# Patient Record
Sex: Male | Born: 1975 | Race: White | Hispanic: No | Marital: Married | Smoking: Never smoker
Health system: Southern US, Community
[De-identification: ages and names within clinical notes are randomized; demographics above are authoritative.]

## PROBLEM LIST (undated history)

## (undated) DIAGNOSIS — N200 Calculus of kidney: Secondary | ICD-10-CM

## (undated) DIAGNOSIS — S42402A Unspecified fracture of lower end of left humerus, initial encounter for closed fracture: Secondary | ICD-10-CM

## (undated) DIAGNOSIS — K469 Unspecified abdominal hernia without obstruction or gangrene: Secondary | ICD-10-CM

## (undated) DIAGNOSIS — B029 Zoster without complications: Secondary | ICD-10-CM

## (undated) HISTORY — PX: APPENDECTOMY: SHX54

---

## 2003-05-04 ENCOUNTER — Emergency Department (HOSPITAL_COMMUNITY): Admission: EM | Admit: 2003-05-04 | Discharge: 2003-05-05 | Payer: Self-pay | Admitting: Emergency Medicine

## 2012-04-11 ENCOUNTER — Emergency Department (INDEPENDENT_AMBULATORY_CARE_PROVIDER_SITE_OTHER): Payer: Managed Care, Other (non HMO)

## 2012-04-11 ENCOUNTER — Encounter (HOSPITAL_BASED_OUTPATIENT_CLINIC_OR_DEPARTMENT_OTHER): Payer: Self-pay | Admitting: *Deleted

## 2012-04-11 ENCOUNTER — Emergency Department (HOSPITAL_BASED_OUTPATIENT_CLINIC_OR_DEPARTMENT_OTHER)
Admission: EM | Admit: 2012-04-11 | Discharge: 2012-04-11 | Disposition: A | Discharge status: Home / Self Care | Payer: Managed Care, Other (non HMO) | Attending: Emergency Medicine | Admitting: Emergency Medicine

## 2012-04-11 DIAGNOSIS — N1330 Unspecified hydronephrosis: Secondary | ICD-10-CM | POA: Insufficient documentation

## 2012-04-11 DIAGNOSIS — N200 Calculus of kidney: Secondary | ICD-10-CM

## 2012-04-11 DIAGNOSIS — R109 Unspecified abdominal pain: Secondary | ICD-10-CM | POA: Insufficient documentation

## 2012-04-11 DIAGNOSIS — N201 Calculus of ureter: Secondary | ICD-10-CM | POA: Insufficient documentation

## 2012-04-11 DIAGNOSIS — N289 Disorder of kidney and ureter, unspecified: Secondary | ICD-10-CM

## 2012-04-11 HISTORY — DX: Calculus of kidney: N20.0

## 2012-04-11 LAB — URINALYSIS, ROUTINE W REFLEX MICROSCOPIC
Glucose, UA: NEGATIVE mg/dL
Protein, ur: NEGATIVE mg/dL
Specific Gravity, Urine: 1.015 (ref 1.005–1.030)
Urobilinogen, UA: 0.2 mg/dL (ref 0.0–1.0)

## 2012-04-11 LAB — URINE MICROSCOPIC-ADD ON

## 2012-04-11 MED ORDER — TAMSULOSIN HCL 0.4 MG PO CAPS: 0.4000 mg | ORAL_CAPSULE | Freq: Every day | ORAL | Status: DC

## 2012-04-11 MED ORDER — OXYCODONE-ACETAMINOPHEN 5-325 MG PO TABS: 2.0000 | ORAL_TABLET | ORAL | Status: AC | PRN

## 2012-04-11 NOTE — ED Notes (Signed)
Patient transported to CT 

## 2012-04-11 NOTE — ED Provider Notes (Signed)
History     CSN: [id number]  Arrival date & time 04/11/12  1429   First MD Initiated Contact with Patient 04/11/12 1445      Chief Complaint  Patient presents with  . Flank Pain    (Consider location/radiation/quality/duration/timing/severity/associated sxs/prior treatment) Patient is a 36 y.o. male presenting with flank pain. The history is provided by the patient. No language interpreter was used.  Flank Pain This is a new problem. The current episode started today. The problem occurs constantly. The problem has been gradually worsening. Associated symptoms include abdominal pain. The symptoms are aggravated by nothing. He has tried nothing for the symptoms. The treatment provided moderate relief.  Pt complains of left flank pain.   Pt reports he has had kidney stones in the past and this pain is similar.  Pt reports he took 2 ibuprofen before leaving home and feels some better.  Past Medical History  Diagnosis Date  . Kidney calculi     Past Surgical History  Procedure Date  . Appendectomy     History reviewed. No pertinent family history.  History  Substance Use Topics  . Smoking status: Never Smoker   . Smokeless tobacco: Not on file  . Alcohol Use: No      Review of Systems  Gastrointestinal: Positive for abdominal pain.  Genitourinary: Positive for flank pain.  All other systems reviewed and are negative.    Allergies  Review of patient's allergies indicates no known allergies.  Home Medications   Current Outpatient Rx  Name Route Sig Dispense Refill  . AMOXICILLIN 500 MG PO TABS Oral Take 500 mg by mouth 2 (two) times daily.    . IBUPROFEN 200 MG PO TABS Oral Take 400 mg by mouth every 6 (six) hours as needed. Patient used this medication for his pain.      BP 140/90  Pulse 87  Temp(Src) 98.5 F (36.9 C) (Oral)  Resp 16  Ht 6' (1.829 m)  Wt 202 lb (91.627 kg)  BMI 27.40 kg/m2  SpO2 100%  Physical Exam  Nursing note and vitals  reviewed. Constitutional: He appears well-developed and well-nourished.  HENT:  Head: Normocephalic and atraumatic.  Right Ear: External ear normal.  Left Ear: External ear normal.  Eyes: Pupils are equal, round, and reactive to light.  Neck: Normal range of motion.  Cardiovascular: Normal rate.   Pulmonary/Chest: Effort normal.  Abdominal: Soft. There is no tenderness.  Musculoskeletal: Normal range of motion.  Neurological: He is alert.  Skin: Skin is warm.    ED Course  Procedures (including critical care time)  Labs Reviewed  URINALYSIS, ROUTINE W REFLEX MICROSCOPIC - Abnormal; Notable for the following:    Leukocytes, UA SMALL (*)    All other components within normal limits  URINE MICROSCOPIC-ADD ON   No results found.   No diagnosis found.    MDM   Results for orders placed during the hospital encounter of 04/11/12  URINALYSIS, ROUTINE W REFLEX MICROSCOPIC      Component Value Range   Color, Urine YELLOW  YELLOW    APPearance CLEAR  CLEAR    Specific Gravity, Urine 1.015  1.005 - 1.030    pH 7.0  5.0 - 8.0    Glucose, UA NEGATIVE  NEGATIVE (mg/dL)   Hgb urine dipstick NEGATIVE  NEGATIVE    Bilirubin Urine NEGATIVE  NEGATIVE    Ketones, ur NEGATIVE  NEGATIVE (mg/dL)   Protein, ur NEGATIVE  NEGATIVE (mg/dL)   Urobilinogen, UA 0.2  0.0 - 1.0 (mg/dL)   Nitrite NEGATIVE  NEGATIVE    Leukocytes, UA SMALL (*) NEGATIVE   URINE MICROSCOPIC-ADD ON      Component Value Range   Squamous Epithelial / LPF RARE  RARE    WBC, UA 3-6  <3 (WBC/hpf)   RBC / HPF 0-2  <3 (RBC/hpf)   Bacteria, UA RARE  RARE    Ct Abdomen Pelvis Wo Contrast  04/11/2012  *RADIOLOGY REPORT*  Clinical Data:  right flank pain, history of nephrolithiasis, prior appendectomy  CT ABDOMEN AND PELVIS WITHOUT CONTRAST  Technique:  Multidetector CT imaging of the abdomen and pelvis was performed following the standard protocol without intravenous contrast.  Comparison: None.  Findings: Right kidney  demonstrates mild hydronephrosis and proximal hydroureter.  Trace amount of perinephric and peri ureteral edema / stranding proximally.  This is secondary to a proximal right ureteral mildly obstructing calculus, maximal diameter 4 mm, image 46.  Low density cortical area in the right kidney lower pole roughly measures 1.5 cm, suspicious for a small right renal cyst but indeterminate by noncontrast CT.  Consider follow-up ultrasound for correlation.  Left kidney demonstrates a branching staghorn calculus in the mid to lower pole roughly measuring 3 x 2 cm on coronal image 51.  No acute obstruction on the left side.  Lung bases clear.  Normal heart size.  No pericardial or pleural effusion.  Liver, collapsed gallbladder, biliary system, pancreas, spleen, and adrenal glands are within normal limits for noncontrast study.  Negative for bowel obstruction, dilatation, ileus, or free air.  No abdominal free fluid, fluid collection, hemorrhage, adenopathy, or abscess  No pelvic free fluid, fluid collection, hemorrhage, hematoma, adenopathy, inguinal abnormality or hernia.  Pelvic calcifications likely venous phleboliths.  No acute osseous finding.  IMPRESSION: 4 mm mildly obstructing proximal right ureteral calculus with mild right hydronephrosis and hydroureter.  Mid to lower pole left staghorn calculus roughly measuring 3 x 2 cm.  Indeterminate low density lesion right kidney lower pole, could represent a renal cyst.  Consider follow-up ultrasound.  Original Report Authenticated By: Judie Petit. Ruel Favors, M.D.      Pt is still pain free.  I counseled pt I advised follow up with Urology.  Pt counseled on results.     Lonia Skinner Thompson, [place] 04/12/12 2203

## 2012-04-11 NOTE — Discharge Instructions (Signed)
Kidney Stones Kidney stones (ureteral lithiasis) are solid masses that form inside your kidneys. The intense pain is caused by the stone moving through the kidney, ureter, bladder, and urethra (urinary tract). When the stone moves, the ureter starts to spasm around the stone. The stone is usually passed in the urine.  HOME CARE  Drink enough fluids to keep your pee (urine) clear or pale yellow. This helps to get the stone out.   Strain all pee through the provided strainer. Do not pee without peeing through the strainer, not even once. If you pee the stone out, catch it. The stone may be as small as a grain of salt. Take this to your doctor.   Only take medicine as told by your doctor.   Follow up with your doctor as told.   Get follow-up X-rays as told by your doctor.  GET HELP RIGHT AWAY IF:   Your pain does not get better with medicine.   You have a fever.   Your pain increases and gets worse over 18 hours.   You have new belly (abdominal) pain.   You feel faint or pass out.  MAKE SURE YOU:   Understand these instructions.   Will watch your condition.   Will get help right away if you are not doing well or get worse.  Document Released: 05/11/2008 Document Revised: 11/12/2011 Document Reviewed: 09/20/2009 ExitCare Patient Information 2012 [employer].Kidney Stones Kidney stones (ureteral lithiasis) are solid masses that form inside your kidneys. The intense pain is caused by the stone moving through the kidney, ureter, bladder, and urethra (urinary tract). When the stone moves, the ureter starts to spasm around the stone. The stone is usually passed in the urine.  HOME CARE  Drink enough fluids to keep your pee (urine) clear or pale yellow. This helps to get the stone out.   Strain all pee through the provided strainer. Do not pee without peeing through the strainer, not even once. If you pee the stone out, catch it. The stone may be as small as a grain of salt. Take this  to your doctor.   Only take medicine as told by your doctor.   Follow up with your doctor as told.   Get follow-up X-rays as told by your doctor.  GET HELP RIGHT AWAY IF:   Your pain does not get better with medicine.   You have a fever.   Your pain increases and gets worse over 18 hours.   You have new belly (abdominal) pain.   You feel faint or pass out.  MAKE SURE YOU:   Understand these instructions.   Will watch your condition.   Will get help right away if you are not doing well or get worse.  Document Released: 05/11/2008 Document Revised: 11/12/2011 Document Reviewed: 09/20/2009 Exit[employer] Patient Information 2012 [employer]. 

## 2012-04-11 NOTE — ED Notes (Signed)
Pt c/o left flank pain x 2 hrs ago, Hx kidney stones

## 2012-04-14 NOTE — ED Provider Notes (Signed)
Medical screening examination/treatment/procedure(s) were performed by non-physician practitioner and as supervising physician I was immediately available for consultation/collaboration.  Izora Benn, MD 04/14/12 0659 

## 2012-05-20 ENCOUNTER — Other Ambulatory Visit: Payer: Self-pay | Admitting: Urology

## 2012-05-20 DIAGNOSIS — N200 Calculus of kidney: Secondary | ICD-10-CM

## 2012-05-31 ENCOUNTER — Encounter (HOSPITAL_COMMUNITY): Payer: Self-pay | Admitting: Pharmacy Technician

## 2012-05-31 ENCOUNTER — Other Ambulatory Visit: Payer: Self-pay | Admitting: Radiology

## 2012-06-01 ENCOUNTER — Encounter (HOSPITAL_COMMUNITY)
Admission: RE | Admit: 2012-06-01 | Discharge: 2012-06-01 | Disposition: A | Discharge status: Home / Self Care | Payer: Managed Care, Other (non HMO) | Source: Ambulatory Visit | Attending: Urology | Admitting: Urology

## 2012-06-01 ENCOUNTER — Encounter (HOSPITAL_COMMUNITY): Payer: Self-pay

## 2012-06-01 DIAGNOSIS — B029 Zoster without complications: Secondary | ICD-10-CM

## 2012-06-01 DIAGNOSIS — K469 Unspecified abdominal hernia without obstruction or gangrene: Secondary | ICD-10-CM

## 2012-06-01 DIAGNOSIS — S42402A Unspecified fracture of lower end of left humerus, initial encounter for closed fracture: Secondary | ICD-10-CM

## 2012-06-01 HISTORY — DX: Unspecified abdominal hernia without obstruction or gangrene: K46.9

## 2012-06-01 HISTORY — DX: Unspecified fracture of lower end of left humerus, initial encounter for closed fracture: S42.402A

## 2012-06-01 HISTORY — PX: TESTICLE SURGERY: SHX794

## 2012-06-01 HISTORY — DX: Zoster without complications: B02.9

## 2012-06-01 LAB — BASIC METABOLIC PANEL
BUN: 18 mg/dL (ref 6–23)
CO2: 31 mEq/L (ref 19–32)
Glucose, Bld: 104 mg/dL — ABNORMAL HIGH (ref 70–99)
Potassium: 4.7 mEq/L (ref 3.5–5.1)
Sodium: 139 mEq/L (ref 135–145)

## 2012-06-01 LAB — CBC
Hemoglobin: 14 g/dL (ref 13.0–17.0)
MCH: 27.9 pg (ref 26.0–34.0)
MCV: 84.1 fL (ref 78.0–100.0)
RBC: 5.02 MIL/uL (ref 4.22–5.81)

## 2012-06-01 NOTE — Pre-Procedure Instructions (Signed)
06-01-12 1545 pt. Notified to fill RX for Mupirocin and use as directed. Positive for Staph aureus with PCR screen today.

## 2012-06-01 NOTE — Patient Instructions (Addendum)
20 George Barton  06/01/2012   Your procedure is scheduled on:  7-9 -2013  Report to [facility] Interventional Radiology at   [facility]     AM .  Call this number if you have problems the morning of surgery: [phone]   Remember:   Do not eat food:After Midnight.    Take these medicines the morning of surgery with A SIP OF WATER: none   Do not wear jewelry, make-up or nail polish.  Do not wear lotions, powders, or perfumes. You may wear deodorant.  Do not shave 48 hours prior to surgery.(face and neck okay, no shaving of legs)  Do not bring valuables to the hospital.  Contacts, dentures or bridgework may not be worn into surgery.  Leave suitcase in the car. After surgery it may be brought to your room.  For patients admitted to the hospital, checkout time is 11:00 AM the day of discharge.   Patients discharged the day of surgery will not be allowed to drive home.  Name and phone number of your driver: spouse  Special Instructions: CHG Shower Use Special Wash: 1/2 bottle night before surgery and 1/2 bottle morning of surgery.(avoid face and genitals)   Please read over the following fact sheets that you were given: MRSA Information, Blood Transfusion fact sheet, Incentive Spirometry Instruction.

## 2012-06-07 ENCOUNTER — Encounter (HOSPITAL_COMMUNITY): Payer: Self-pay | Admitting: Pharmacy Technician

## 2012-06-10 ENCOUNTER — Other Ambulatory Visit: Payer: Self-pay | Admitting: Physician Assistant

## 2012-06-13 ENCOUNTER — Telehealth (HOSPITAL_COMMUNITY): Payer: Self-pay | Admitting: Urology

## 2012-06-13 NOTE — H&P (Signed)
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 Pt underwent CTU May 2013 for right flank pain which revealed a 4 mm proximal right ureteral calculus with mild hydro and a ~3 x 2 cm left staghorn calculus. KUB revealed the left staghorn but no other stones. I revealed the KUB and CT images. Pt UA cleared and he is having no further flank pain. His urine is clear.   Has passed several stones (a group of tiny stones) 4-5 yrs ago. He has never needed stone surgery.   Interval history   He returns and passed the right ureteral stone. He has no residual flank pain. He has no dysuria or hematuria.   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 He was started on 10 mEq K Citrate TID.    Past Medical History Problems  1. History of  Nephrolithiasis V13.01  Surgical History Problems  1. History of  Appendectomy  Current Meds 1. Ibuprofen 800 MG Oral Tablet; Therapy: 26Mar2013 to 2. OxyCODONE HCl CAPS; Therapy: (Recorded:28May2013) to 3. Potassium Citrate ER 10 MEQ (1080 MG) Oral Tablet Extended Release; TAKE 1 TABLET TID  with meals; Therapy: 10Jun2013 to (Evaluate:05Jun2014)  Requested for: 10Jun2013; Last  Rx:10Jun2013  Allergies Medication  1. No Known Drug Allergies  Family History Problems  1. Family history of  Family Health Status Number Of Children 1 daughter  Social History Problems  1. Caffeine Use 2. Never A Smoker 3. 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 History of  Alcohol Use 3-4 cups daily 5. History of  Tobacco Use 305.1  Review of Systems Genitourinary, constitutional, skin, eye, otolaryngeal, hematologic/lymphatic, cardiovascular, pulmonary, endocrine, musculoskeletal, gastrointestinal, neurological and psychiatric system(s) were reviewed and pertinent findings if present are noted.    Vitals Vital Signs [Data Includes:  Last 1 Day]  13Jun2013 01:25PM  BMI Calculated: 25.01 BSA Calculated: 2.19 Height: 6 ft 3 in Weight: 199 lb  Blood Pressure: 116 / 78 Heart Rate: 73  Physical Exam Constitutional: Well nourished and well developed . No acute distress.  ENT:. The ears and nose are normal in appearance.  Neck: The appearance of the neck is normal and no neck mass is present.  Pulmonary: No respiratory distress and normal respiratory rhythm and effort.  Cardiovascular: Heart rate and rhythm are normal . No peripheral edema.  Abdomen: The abdomen is soft and nontender. No masses are palpated. No CVA tenderness. No hernias are palpable. No hepatosplenomegaly noted.  Lymphatics: The femoral and inguinal nodes are not enlarged or tender.  Skin: Normal skin turgor, no visible rash and no visible skin lesions.  Neuro/Psych:. Mood and affect are appropriate.    Results/Data Urine [Data Includes: Last 1 Day]   13Jun2013  COLOR YELLOW   APPEARANCE CLEAR   SPECIFIC GRAVITY 1.020   pH 6.5   GLUCOSE NEG mg/dL  BILIRUBIN NEG   KETONE NEG mg/dL  BLOOD TRACE   PROTEIN NEG mg/dL  UROBILINOGEN 0.2 mg/dL  NITRITE NEG   LEUKOCYTE ESTERASE TRACE   SQUAMOUS EPITHELIAL/HPF RARE   WBC 4-6 WBC/hpf  RBC 0-3 RBC/hpf  BACTERIA RARE   CRYSTALS NONE SEEN   CASTS NONE SEEN    Assessment Assessed  1. 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 Follow-up Schedule Surgery Office  Follow-up  Done: 13Jun2013  Discussion/Summary  Based on his 24 hour stone profile we discussed increasing his  fluids and he was tarted on 10 mEq of potassium citrate t.i.d. He was also instructed on a normal calcium intake.  We also went back over the nature, risks and benefits of PCNL. We discussed risks of bleeding, infection and injury to the kidney. We discussed injury to adjacent structures such as the colon. Discuss rare bleeding requiring transfusion or embolization. We discussed the need for staged or multiple  procedures and ureteral stents or nephrostomy tubes. All questions answered and he elects to proceed.  Urine sent for Cx as a precaution.       Signatures Electronically signed by : Jerilee Field, M.D.; May 20 2012 11:58AM

## 2012-06-14 ENCOUNTER — Ambulatory Visit (HOSPITAL_COMMUNITY): Payer: Managed Care, Other (non HMO)

## 2012-06-14 ENCOUNTER — Encounter (HOSPITAL_COMMUNITY)
Admission: RE | Disposition: A | Discharge status: Home / Self Care | Payer: Self-pay | Source: Ambulatory Visit | Attending: Urology

## 2012-06-14 ENCOUNTER — Encounter (HOSPITAL_COMMUNITY): Payer: Self-pay | Admitting: Anesthesiology

## 2012-06-14 ENCOUNTER — Ambulatory Visit (HOSPITAL_COMMUNITY): Payer: Managed Care, Other (non HMO) | Admitting: Anesthesiology

## 2012-06-14 ENCOUNTER — Encounter (HOSPITAL_COMMUNITY): Payer: Self-pay | Admitting: *Deleted

## 2012-06-14 ENCOUNTER — Ambulatory Visit (HOSPITAL_COMMUNITY)
Admission: RE | Admit: 2012-06-14 | Discharge: 2012-06-14 | Disposition: A | Discharge status: Home / Self Care | Payer: Managed Care, Other (non HMO) | Source: Ambulatory Visit | Attending: Urology | Admitting: Urology

## 2012-06-14 ENCOUNTER — Inpatient Hospital Stay (HOSPITAL_COMMUNITY)
Admission: RE | Admit: 2012-06-14 | Discharge: 2012-06-17 | DRG: 660 | Disposition: A | Discharge status: Home / Self Care | Payer: Managed Care, Other (non HMO) | Source: Ambulatory Visit | Attending: Urology | Admitting: Urology

## 2012-06-14 DIAGNOSIS — N201 Calculus of ureter: Secondary | ICD-10-CM | POA: Diagnosis present

## 2012-06-14 DIAGNOSIS — N200 Calculus of kidney: Principal | ICD-10-CM | POA: Diagnosis present

## 2012-06-14 DIAGNOSIS — Z87891 Personal history of nicotine dependence: Secondary | ICD-10-CM

## 2012-06-14 DIAGNOSIS — Z9089 Acquired absence of other organs: Secondary | ICD-10-CM

## 2012-06-14 DIAGNOSIS — N1330 Unspecified hydronephrosis: Secondary | ICD-10-CM | POA: Diagnosis present

## 2012-06-14 HISTORY — PX: NEPHROLITHOTOMY: SHX5134

## 2012-06-14 LAB — GLUCOSE, CAPILLARY: Glucose-Capillary: 145 mg/dL — ABNORMAL HIGH (ref 70–99)

## 2012-06-14 LAB — ABO/RH: ABO/RH(D): A POS

## 2012-06-14 SURGERY — NEPHROLITHOTOMY PERCUTANEOUS
Anesthesia: General | Laterality: Left | Wound class: Clean Contaminated

## 2012-06-14 MED ORDER — DEXAMETHASONE SODIUM PHOSPHATE 10 MG/ML IJ SOLN
INTRAMUSCULAR | Status: DC | PRN
  Administered 2012-06-14: 10 mg via INTRAVENOUS

## 2012-06-14 MED ORDER — FENTANYL CITRATE 0.05 MG/ML IJ SOLN
INTRAMUSCULAR | Status: AC
  Filled 2012-06-14: qty 4

## 2012-06-14 MED ORDER — ONDANSETRON HCL 4 MG/2ML IJ SOLN
INTRAMUSCULAR | Status: DC | PRN
  Administered 2012-06-14: 4 mg via INTRAVENOUS

## 2012-06-14 MED ORDER — ACETAMINOPHEN 10 MG/ML IV SOLN
INTRAVENOUS | Status: AC
  Filled 2012-06-14: qty 100

## 2012-06-14 MED ORDER — MIDAZOLAM HCL 5 MG/5ML IJ SOLN
INTRAMUSCULAR | Status: AC | PRN
  Administered 2012-06-14: 2 mg via INTRAVENOUS
  Administered 2012-06-14: 4 mg via INTRAVENOUS

## 2012-06-14 MED ORDER — PROPOFOL 10 MG/ML IV BOLUS
INTRAVENOUS | Status: DC | PRN
  Administered 2012-06-14: 180 mg via INTRAVENOUS

## 2012-06-14 MED ORDER — CEFAZOLIN SODIUM 1-5 GM-% IV SOLN
1.0000 g | Freq: Three times a day (TID) | INTRAVENOUS | Status: DC
  Administered 2012-06-14 – 2012-06-17 (×9): 1 g via INTRAVENOUS
  Filled 2012-06-14 (×10): qty 50

## 2012-06-14 MED ORDER — DOCUSATE SODIUM 100 MG PO CAPS
100.0000 mg | ORAL_CAPSULE | Freq: Two times a day (BID) | ORAL | Status: DC
  Administered 2012-06-15 – 2012-06-17 (×5): 100 mg via ORAL
  Filled 2012-06-14 (×8): qty 1

## 2012-06-14 MED ORDER — FENTANYL CITRATE 0.05 MG/ML IJ SOLN
25.0000 ug | INTRAMUSCULAR | Status: DC | PRN
  Administered 2012-06-14 (×2): 25 ug via INTRAVENOUS

## 2012-06-14 MED ORDER — FENTANYL CITRATE 0.05 MG/ML IJ SOLN
INTRAMUSCULAR | Status: DC | PRN
  Administered 2012-06-14: 100 ug via INTRAVENOUS

## 2012-06-14 MED ORDER — ACETAMINOPHEN 10 MG/ML IV SOLN
INTRAVENOUS | Status: DC | PRN
  Administered 2012-06-14: 1000 mg via INTRAVENOUS

## 2012-06-14 MED ORDER — MIDAZOLAM HCL 5 MG/5ML IJ SOLN
INTRAMUSCULAR | Status: DC | PRN
  Administered 2012-06-14: 2 mg via INTRAVENOUS

## 2012-06-14 MED ORDER — FENTANYL CITRATE 0.05 MG/ML IJ SOLN
INTRAMUSCULAR | Status: AC | PRN
  Administered 2012-06-14 (×3): 100 ug via INTRAVENOUS

## 2012-06-14 MED ORDER — CEFAZOLIN SODIUM-DEXTROSE 2-3 GM-% IV SOLR
2.0000 g | Freq: Once | INTRAVENOUS | Status: AC
  Administered 2012-06-14: 2 g via INTRAVENOUS

## 2012-06-14 MED ORDER — CIPROFLOXACIN IN D5W 400 MG/200ML IV SOLN
INTRAVENOUS | Status: AC
  Filled 2012-06-14: qty 200

## 2012-06-14 MED ORDER — EPHEDRINE SULFATE 50 MG/ML IJ SOLN
INTRAMUSCULAR | Status: DC | PRN
  Administered 2012-06-14 (×2): 10 mg via INTRAVENOUS

## 2012-06-14 MED ORDER — FENTANYL CITRATE 0.05 MG/ML IJ SOLN
INTRAMUSCULAR | Status: AC
  Filled 2012-06-14: qty 2

## 2012-06-14 MED ORDER — CIPROFLOXACIN IN D5W 400 MG/200ML IV SOLN
400.0000 mg | INTRAVENOUS | Status: AC
  Administered 2012-06-14: 400 mg via INTRAVENOUS

## 2012-06-14 MED ORDER — HYDROMORPHONE HCL PF 1 MG/ML IJ SOLN
INTRAMUSCULAR | Status: AC
  Filled 2012-06-14: qty 1

## 2012-06-14 MED ORDER — OXYCODONE-ACETAMINOPHEN 5-325 MG PO TABS
1.0000 | ORAL_TABLET | ORAL | Status: DC | PRN
  Administered 2012-06-14 – 2012-06-17 (×7): 1 via ORAL
  Filled 2012-06-14: qty 1
  Filled 2012-06-14: qty 2
  Filled 2012-06-14 (×6): qty 1

## 2012-06-14 MED ORDER — ROCURONIUM BROMIDE 100 MG/10ML IV SOLN
INTRAVENOUS | Status: DC | PRN
  Administered 2012-06-14: 50 mg via INTRAVENOUS

## 2012-06-14 MED ORDER — HYDROMORPHONE HCL PF 1 MG/ML IJ SOLN
0.5000 mg | INTRAMUSCULAR | Status: DC | PRN
  Administered 2012-06-14 – 2012-06-16 (×3): 1 mg via INTRAVENOUS
  Filled 2012-06-14 (×3): qty 1

## 2012-06-14 MED ORDER — IOHEXOL 300 MG/ML  SOLN
INTRAMUSCULAR | Status: DC | PRN
  Administered 2012-06-14: 50 mL via ORAL

## 2012-06-14 MED ORDER — SODIUM CHLORIDE 0.9 % IV SOLN
INTRAVENOUS | Status: DC
  Administered 2012-06-14: 23:00:00 via INTRAVENOUS

## 2012-06-14 MED ORDER — ACETAMINOPHEN 325 MG PO TABS
650.0000 mg | ORAL_TABLET | ORAL | Status: DC | PRN
  Administered 2012-06-17: 650 mg via ORAL
  Filled 2012-06-14: qty 2

## 2012-06-14 MED ORDER — FLUTICASONE PROPIONATE 50 MCG/ACT NA SUSP
2.0000 | Freq: Every day | NASAL | Status: DC
  Filled 2012-06-14: qty 16

## 2012-06-14 MED ORDER — SODIUM CHLORIDE 0.9 % IJ SOLN: 3.0000 mL | INTRAMUSCULAR | Status: DC | PRN

## 2012-06-14 MED ORDER — IOHEXOL 300 MG/ML  SOLN
INTRAMUSCULAR | Status: AC
  Filled 2012-06-14: qty 2

## 2012-06-14 MED ORDER — HYDROMORPHONE HCL PF 1 MG/ML IJ SOLN
0.2500 mg | INTRAMUSCULAR | Status: DC | PRN
  Administered 2012-06-14 (×2): 0.5 mg via INTRAVENOUS

## 2012-06-14 MED ORDER — SODIUM CHLORIDE 0.9 % IJ SOLN
3.0000 mL | Freq: Two times a day (BID) | INTRAMUSCULAR | Status: DC
  Administered 2012-06-15: 3 mL via INTRAVENOUS

## 2012-06-14 MED ORDER — MIDAZOLAM HCL 2 MG/2ML IJ SOLN
INTRAMUSCULAR | Status: AC
  Filled 2012-06-14: qty 4

## 2012-06-14 MED ORDER — ONDANSETRON HCL 4 MG/2ML IJ SOLN
4.0000 mg | INTRAMUSCULAR | Status: DC | PRN
  Administered 2012-06-14: 4 mg via INTRAVENOUS
  Filled 2012-06-14: qty 2

## 2012-06-14 MED ORDER — BACITRACIN-NEOMYCIN-POLYMYXIN 400-5-5000 EX OINT: 1.0000 "application " | TOPICAL_OINTMENT | Freq: Three times a day (TID) | CUTANEOUS | Status: DC | PRN

## 2012-06-14 MED ORDER — LACTATED RINGERS IV SOLN
INTRAVENOUS | Status: DC
  Administered 2012-06-14: 15:00:00 via INTRAVENOUS

## 2012-06-14 MED ORDER — CEFAZOLIN SODIUM-DEXTROSE 2-3 GM-% IV SOLR
INTRAVENOUS | Status: AC
  Filled 2012-06-14: qty 50

## 2012-06-14 MED ORDER — LACTATED RINGERS IV SOLN
INTRAVENOUS | Status: DC
  Administered 2012-06-14: 13:00:00 via INTRAVENOUS
  Administered 2012-06-14: 1000 mL via INTRAVENOUS

## 2012-06-14 MED ORDER — SODIUM CHLORIDE 0.9 % IR SOLN
Status: DC | PRN
  Administered 2012-06-14: 12000 mL

## 2012-06-14 MED ORDER — LACTATED RINGERS IV SOLN
INTRAVENOUS | Status: DC
  Administered 2012-06-14 – 2012-06-15 (×2): via INTRAVENOUS

## 2012-06-14 MED ORDER — IOHEXOL 300 MG/ML  SOLN: 30.0000 mL | Freq: Once | INTRAMUSCULAR | Status: DC | PRN

## 2012-06-14 MED ORDER — SODIUM CHLORIDE 0.9 % IV SOLN: 250.0000 mL | INTRAVENOUS | Status: DC

## 2012-06-14 SURGICAL SUPPLY — 46 items
BAG URINE DRAINAGE (UROLOGICAL SUPPLIES) ×2 IMPLANT
BASKET STONE NITINOL 3FRX115MB (UROLOGICAL SUPPLIES) IMPLANT
BASKET ZERO TIP NITINOL 2.4FR (BASKET) ×2 IMPLANT
BENZOIN TINCTURE PRP APPL 2/3 (GAUZE/BANDAGES/DRESSINGS) ×4 IMPLANT
CATH FOLEY 2W COUNCIL 20FR 5CC (CATHETERS) ×4 IMPLANT
CATH INTERMIT  6FR 70CM (CATHETERS) ×2 IMPLANT
CATH ROBINSON RED A/P 20FR (CATHETERS) IMPLANT
CATH URET DUAL LUMEN 6-10FR 50 (CATHETERS) ×2 IMPLANT
CATH X-FORCE N30 NEPHROSTOMY (TUBING) ×2 IMPLANT
CLOTH BEACON ORANGE TIMEOUT ST (SAFETY) ×2 IMPLANT
COVER SURGICAL LIGHT HANDLE (MISCELLANEOUS) ×2 IMPLANT
DRAPE C-ARM 42X72 X-RAY (DRAPES) ×2 IMPLANT
DRAPE CAMERA CLOSED 9X96 (DRAPES) ×2 IMPLANT
DRAPE LINGEMAN PERC (DRAPES) ×2 IMPLANT
DRAPE SURG IRRIG POUCH 19X23 (DRAPES) ×2 IMPLANT
DRAPE UTILITY XL STRL (DRAPES) ×2 IMPLANT
DRSG TEGADERM 8X12 (GAUZE/BANDAGES/DRESSINGS) ×4 IMPLANT
GLOVE BIOGEL M 8.0 STRL (GLOVE) ×2 IMPLANT
GLOVE BIOGEL M STRL SZ7.5 (GLOVE) ×2 IMPLANT
GOWN STRL REIN XL XLG (GOWN DISPOSABLE) ×4 IMPLANT
KIT BASIN OR (CUSTOM PROCEDURE TRAY) ×2 IMPLANT
LASER FIBER DISP (UROLOGICAL SUPPLIES) IMPLANT
LASER FIBER DISP 1000U (UROLOGICAL SUPPLIES) IMPLANT
MANIFOLD NEPTUNE II (INSTRUMENTS) ×2 IMPLANT
NS IRRIG 1000ML POUR BTL (IV SOLUTION) ×2 IMPLANT
PACK BASIC VI WITH GOWN DISP (CUSTOM PROCEDURE TRAY) ×4 IMPLANT
PAD ABD 7.5X8 STRL (GAUZE/BANDAGES/DRESSINGS) ×4 IMPLANT
PROBE EHL 9 FR 470CM (MISCELLANEOUS) IMPLANT
PROBE LITHOCLAST ULTRA 3.8X403 (UROLOGICAL SUPPLIES) ×2 IMPLANT
PROBE PNEUMATIC 1.0MMX570MM (UROLOGICAL SUPPLIES) ×2 IMPLANT
SET IRRIG Y TYPE TUR BLADDER L (SET/KITS/TRAYS/PACK) ×2 IMPLANT
SET WARMING FLUID IRRIGATION (MISCELLANEOUS) ×2 IMPLANT
SPONGE GAUZE 4X4 12PLY (GAUZE/BANDAGES/DRESSINGS) ×2 IMPLANT
SPONGE LAP 4X18 X RAY DECT (DISPOSABLE) ×2 IMPLANT
STENT CONTOUR 6FRX26X.038 (STENTS) IMPLANT
STENT ENDOURETEROTOMY 7-14 26C (STENTS) IMPLANT
STONE CATCHER W/TUBE ADAPTER (UROLOGICAL SUPPLIES) ×2 IMPLANT
SUT SILK 2 0 30  PSL (SUTURE) ×2
SUT SILK 2 0 30 PSL (SUTURE) ×2 IMPLANT
SYR 20CC LL (SYRINGE) ×4 IMPLANT
SYR 30ML LL (SYRINGE) ×2 IMPLANT
SYRINGE 10CC LL (SYRINGE) ×2 IMPLANT
TOWEL OR NON WOVEN STRL DISP B (DISPOSABLE) ×2 IMPLANT
TRAY FOLEY CATH 14FRSI W/METER (CATHETERS) ×2 IMPLANT
TUBING CONNECTING 10 (TUBING) ×6 IMPLANT
WATER STERILE IRR 1500ML POUR (IV SOLUTION) ×2 IMPLANT

## 2012-06-14 NOTE — Brief Op Note (Signed)
06/14/2012  1:43 PM  PATIENT:  George Barton  36 y.o. male  PRE-OPERATIVE DIAGNOSIS:  left nephrolithiasis  POST-OPERATIVE DIAGNOSIS:  left nephrolithiasis  PROCEDURE:  Procedure(s) (LRB): NEPHROLITHOTOMY PERCUTANEOUS (Left)  SURGEON:  Surgeon(s) and Role:    * [name], MD - Primary  ANESTHESIA:   general  EBL:  Total I/O In: 1000 [I.V.:1000] Out: - not recorded...continuous irrigation  BLOOD ADMINISTERED:none  DRAINS: Urinary Catheter (Foley) , Left nephrostomy, Left nephroureteral stent  LOCAL MEDICATIONS USED:  NONE  SPECIMEN:  Source of Specimen:  stones, left kidney  DISPOSITION OF SPECIMEN:  N/A - office lab   COUNTS:  YES  DICTATION: .Other Dictation: Dictation Number dictated  PLAN OF CARE: Admit for overnight observation  PATIENT DISPOSITION:  PACU - hemodynamically stable.   Delay start of Pharmacological VTE agent (>24hrs) due to surgical blood loss or risk of bleeding: yes  [phone]

## 2012-06-14 NOTE — Transfer of Care (Signed)
Immediate Anesthesia Transfer of Care Note  Patient: George Barton  Procedure(s) Performed: Procedure(s) (LRB): NEPHROLITHOTOMY PERCUTANEOUS (Left)  Patient Location: PACU  Anesthesia Type: General  Level of Consciousness: awake and sedated  Airway & Oxygen Therapy: Patient Spontanous Breathing and Patient connected to face mask oxygen  Post-op Assessment: Report given to PACU RN and Post -op Vital signs reviewed and stable  Post vital signs: Reviewed and stable  Complications: No apparent anesthesia complications

## 2012-06-14 NOTE — Anesthesia Postprocedure Evaluation (Signed)
  Anesthesia Post-op Note  Patient: George Barton  Procedure(s) Performed: Procedure(s) (LRB): NEPHROLITHOTOMY PERCUTANEOUS (Left)  Patient Location: PACU  Anesthesia Type: General  Level of Consciousness: awake and alert   Airway and Oxygen Therapy: Patient Spontanous Breathing  Post-op Pain: mild  Post-op Assessment: Post-op Vital signs reviewed, Patient's Cardiovascular Status Stable, Respiratory Function Stable, Patent Airway and No signs of Nausea or vomiting  Post-op Vital Signs: stable  Complications: No apparent anesthesia complications

## 2012-06-14 NOTE — Anesthesia Preprocedure Evaluation (Signed)

## 2012-06-14 NOTE — H&P (Signed)
George Barton is an 36 y.o. male.   Chief Complaint: left kidney stone HPI: Patient with history of left staghorn renal calculus presents today for percutaneous nephrostomy prior to nephrolithotomy.  Past Medical History  Diagnosis Date  . Kidney calculi   . Shingles 06-01-12    2 yrs ago- outbreak around breast/back  . Hernia 06-01-12    in past-corrected self  . Elbow fracture, left 06-01-12    past hx.pinning-removed    Past Surgical History  Procedure Date  . Appendectomy   . 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 Social History:  reports that he has never smoked. He does not have any smokeless tobacco history on file. He reports that he drinks alcohol. He reports that he does not use illicit drugs.  Allergies: No Known Allergies  Current facility-administered medications:ciprofloxacin (CIPRO) 400 MG/200ML IVPB, , , ,  Current outpatient prescriptions:ibuprofen (ADVIL,MOTRIN) 200 MG tablet, Take 400 mg by mouth every 6 (six) hours as needed. Patient used this medication for his pain., Disp: , Rfl: ;  mometasone (NASONEX) 50 MCG/ACT nasal spray, Place 2 sprays into the nose daily., Disp: , Rfl: ;  potassium citrate (UROCIT-K) 10 MEQ (1080 MG) SR tablet, Take 10 mEq by mouth 3 (three) times daily with meals., Disp: , Rfl:  Facility-Administered Medications Ordered in Other Encounters: 0.9 %  sodium chloride infusion, , Intravenous, Continuous, Simonne Come, MD;  ciprofloxacin (CIPRO) IVPB 400 mg, 400 mg, Intravenous, On Call, Simonne Come, MD;  fentaNYL (SUBLIMAZE) 0.05 MG/ML injection, , , , ;  fentaNYL (SUBLIMAZE) 0.05 MG/ML injection, , , , ;  midazolam (VERSED) 2 MG/2ML injection, , , , ;  midazolam (VERSED) 2 MG/2ML injection, , , ,   Results for orders placed during the hospital encounter of 06/01/12  SURGICAL PCR SCREEN      Component Value Range   MRSA, PCR NEGATIVE  NEGATIVE   Staphylococcus aureus POSITIVE (*) NEGATIVE    APTT      Component Value Range   aPTT 31  24 - 37 seconds  BASIC METABOLIC PANEL      Component Value Range   Sodium 139  135 - 145 mEq/L   Potassium 4.7  3.5 - 5.1 mEq/L   Chloride 100  96 - 112 mEq/L   CO2 31  19 - 32 mEq/L   Glucose, Bld 104 (*) 70 - 99 mg/dL   BUN 18  6 - 23 mg/dL   Creatinine, Ser 1.61  0.50 - 1.35 mg/dL   Calcium 9.8  8.4 - 09.6 mg/dL   GFR calc non Af Amer >90  >90 mL/min   GFR calc Af Amer >90  >90 mL/min  CBC      Component Value Range   WBC 5.5  4.0 - 10.5 K/uL   RBC 5.02  4.22 - 5.81 MIL/uL   Hemoglobin 14.0  13.0 - 17.0 g/dL   HCT 04.5  40.9 - 81.1 %   MCV 84.1  78.0 - 100.0 fL   MCH 27.9  26.0 - 34.0 pg   MCHC 33.2  30.0 - 36.0 g/dL   RDW 91.4  78.2 - 95.6 %   Platelets 187  150 - 400 K/uL  PROTIME-INR      Component Value Range   Prothrombin Time 12.6  11.6 - 15.2 seconds   INR 0.92  0.00 - 1.49    Review of Systems  Constitutional: Negative for  fever and chills.  Respiratory: Negative for cough and shortness of breath.   Cardiovascular: Negative for chest pain.  Gastrointestinal: Negative for nausea, vomiting and abdominal pain.  Genitourinary: Negative for dysuria, hematuria and flank pain.  Musculoskeletal: Negative for back pain.  Neurological: Negative for headaches.  Endo/Heme/Allergies: Does not bruise/bleed easily.   BP  136/82  HR 84  R 21  TEMP 98.9  O2 SATS  97% RA Physical Exam  Constitutional: He is oriented to person, place, and time. He appears well-developed and well-nourished.  Cardiovascular: Normal rate and regular rhythm.   Respiratory: Effort normal and breath sounds normal.  GI: Soft. Bowel sounds are normal. There is no tenderness.  Musculoskeletal: Normal range of motion. He exhibits no edema.  Neurological: He is alert and oriented to person, place, and time.     Assessment/Plan Patient with left staghorn renal calculus; plan is for percutaneous nephrostomy prior to nephrolithotomy. Details/risks of above  d/w pt/family with their understanding and consent.  Anneta Rounds,D KEVIN 06/14/2012, 8:10 AM

## 2012-06-14 NOTE — Interval H&P Note (Signed)
History and Physical Interval Note:  06/14/2012 10:33 AM  George Barton  has presented today for surgery, with the diagnosis of left nephrolithiasis  The various methods of treatment have been discussed with the patient and family. After consideration of risks, benefits and other options for treatment, the patient has consented to  Procedure(s) (LRB): NEPHROLITHOTOMY PERCUTANEOUS (Left) as a surgical intervention .  The patient's history has been reviewed, patient examined, no change in status, stable for surgery.  I have reviewed the patients' chart and labs.  Questions were answered to the patient's satisfaction.  Dr. Grace Isaac with IR called me after gaining nephroureteral access. He couldn't get lower pole access and Barton upper pole access was high risk. He ended up going in a posterior calyx mid-way up the infundibulum.    [name]

## 2012-06-14 NOTE — Progress Notes (Signed)
Patient ID: George Barton, male   DOB: 1975-12-15, 36 y.o.   MRN: [id number]  Pt has some flank pain on left. Got dilaudid, but it made him nauseated and drowsy. Now his pain is better. He has no SOB or abd pain.   Urine in foley clear Urine in left Nx has cleared  Imp : S/p left PCNL - I feel all stone was cleared, but there were a lot of loose stones.   Plan: -continue post-op care -CT A/P check for left residual stones and right distal stone

## 2012-06-14 NOTE — Procedures (Signed)
Successful placement of left sided 5 Fr catheter for PNL access.  Tip of catheter is within the urinary bladder.  No immediate complications.    

## 2012-06-15 ENCOUNTER — Encounter (HOSPITAL_COMMUNITY): Payer: Self-pay | Admitting: Urology

## 2012-06-15 LAB — BASIC METABOLIC PANEL
CO2: 27 mEq/L (ref 19–32)
Chloride: 101 mEq/L (ref 96–112)
Creatinine, Ser: 0.94 mg/dL (ref 0.50–1.35)
Potassium: 4.5 mEq/L (ref 3.5–5.1)

## 2012-06-15 LAB — CBC
HCT: 39.3 % (ref 39.0–52.0)
MCV: 84 fL (ref 78.0–100.0)
Platelets: 202 10*3/uL (ref 150–400)
RBC: 4.68 MIL/uL (ref 4.22–5.81)
WBC: 14.1 10*3/uL — ABNORMAL HIGH (ref 4.0–10.5)

## 2012-06-15 MED ORDER — DOCUSATE SODIUM 100 MG PO CAPS: 100.0000 mg | ORAL_CAPSULE | Freq: Two times a day (BID) | ORAL | Status: AC

## 2012-06-15 MED ORDER — SULFAMETHOXAZOLE-TMP DS 800-160 MG PO TABS: 1.0000 | ORAL_TABLET | Freq: Two times a day (BID) | ORAL | Status: AC

## 2012-06-15 MED ORDER — OXYCODONE-ACETAMINOPHEN 5-325 MG PO TABS: 1.0000 | ORAL_TABLET | ORAL | Status: AC | PRN

## 2012-06-15 NOTE — Progress Notes (Signed)
Noted some leaking around nephrostomy tube /puncture site on left flank area. Dressing change with 4 split gauze,a pack of regular gauze and one abdominal pad x3 since 1930 with old dressing soaked with serosanguinous drainage. Vital signs remain stable. Will monitor closely.

## 2012-06-15 NOTE — Progress Notes (Signed)
Patient ID: George Barton, male   DOB: 03/04/1976, 36 y.o.   MRN: [id number]  Pt complaining of foley irritation and drainage of urine around nephrostomy.   AFVSS Labs stable, wbc up NAD  Left NX removed (urine clear, draining around tube on dressing) Foley removed (urine clear) Ext - no cce  CT - 6 mm LLP fragment remains, NX not in kidney  Imp - POD#1 left PCNL Plan- -removed foley and left PCNx. Will leave nephroureteral stent. Remove next week. -home when tolerating PO. Ambulating. Pain controlled. Possibly later today.

## 2012-06-15 NOTE — Progress Notes (Signed)
Patient ID: George Barton, male   DOB: Sep 29, 1976, 36 y.o.   MRN: [id number]  Patient ambulating and tolerating reg diet. Voiding well. Clear urine.   Left flank - draining urine out of the left PNx site around the nephroureteral stent.   Imp - POD#1 left PCNL  Plan -  Left nephroureteral stent removed. New dressing placed.  Will observe pt until AM.

## 2012-06-15 NOTE — Op Note (Signed)
NAMEALVIN, DIFFEE NO.:  [id number]  MEDICAL RECORD NO.:  [id number]  LOCATION:  1415                         FACILITY:  [facility]  PHYSICIAN:  Jerilee Field, MD   DATE OF BIRTH:  12-Feb-1976  DATE OF PROCEDURE: DATE OF DISCHARGE:                              OPERATIVE REPORT   PREOPERATIVE DIAGNOSIS:  Left staghorn calculus.  POSTOPERATIVE DIAGNOSIS:  Left staghorn calculus.  PROCEDURE:  Left percutaneous nephrolithotomy.  SURGEON:  Jerilee Field, MD  TYPE OF ANESTHESIA:  General.  INDICATION FOR PROCEDURE:  Mr. Silveria is a 36 year old, who was passing a right ureteral stone.  He underwent a CT scan which showed a right distal stone but also a large left 3.5 cm staghorn stone that went up through the lower pole infundibulum toward the midpole and branched into 2 or 3 lower pole calices.  I reviewed the images with the patient.  We discussed the nature, risks and benefits of percutaneous nephrolithotomy.  All questions were answered and he elected to proceed.  DESCRIPTION OF PROCEDURE:  After consent was obtained, the patient was brought to the operating room.  The patient had a posterior lower pole access earlier this morning with Interventional Radiology.  I reviewed the images.  The access came in right in the middle of the stone and posteriorly.  The patient was placed prone after adequate anesthesia and all pressure points were padded.  The left nephrostomy tube and flank were then prepped and draped in the usual sterile fashion.  The nephroureteral catheter was cut and a Super Stiff wire was advanced and coiled in the bladder.  The nephroureteral catheter was then removed leaving the Super Stiff wire in place.  Over this, a coaxial dilator was placed.  The inner sheath was removed and a second Super Stiff was advanced down and coiled in the bladder.  The dilator sheath peel-away was then removed.  The skin was incised.  The balloon dilator was  passed and inflated to 30-French.  An access sheath was then passed to a point right at the mid part of the stone.  The balloon dilator was then removed and both wires were left in place.  The rigid nephroscope was then advanced and the clot was evacuated.  A large stone was seen progressing up toward the UPJ, but inferiorly the stone transitioned from solid to a collection of small 2, 3 and 4-mm round spheres that were stuck together.  With simple irrigation, these spheres began to break up and wash away.  Therefore, I took some rigid graspers and grasped as many of these pieces as I could and removed them manually.  I also used the ultrasonic lithotripter, which nicely suctioned these small pieces and ultrasounded them and dissolved them.  This cleared out 2 of the lower pole calices.  Next up was the large conglomerate of stone going up towards the ureteropelvic junction.  This stone was treated with the ultrasonic lithotripter as well as EHL, which broke it and I fragmented it and cleaned it up nicely.  Finally, the stone broke into several large pieces and these were all removed manually with the 2- prong graspers.  Fluoroscopy then revealed a  more superior-posterior collection of stones from the sheath and a more inferior-posterior collection of stones into other calices.  Therefore, the flexible nephroscope was passed superiorly and posteriorly where another collection of stones was seen.  Again, these were all small spheres of stones that were basically stuck together.  Using a Nitinol basket, the pieces were grasped and removed, and many simply broke up and washed away.  On an individual basis, each piece was too small for the basket except for the very largest pieces.  This calix was completely cleaned out.  Then, using the flexible nephroscope, the inferior-posterior calix was completely cleaned out.  On fluoroscopy, there were no residual fragments visualized.  There were a  few scattered spheres of stones and the ultrasonic lithotripter was used to vacuum these out.  Inspection then noted this area to be clear.  I tried to follow the wires to the UPJ but could not get access with the flexible nephroscope due to the angulation.  Therefore, a 6-French open-ended catheter was advanced over one of the wires into the bladder and the wire was removed leaving ureteral access.  The 6-French catheter was capped over the remaining wire.  The sheath was removed and a 20-French Council was placed, the retrograde was obtained, which showed some extravasation, but also the calices and renal pelvis filling.  The nephrostomy tube balloon was inflated with 2 mL and then left in place in the posterior axis calix. It was sutured to the skin and secured with a silk tie, and the tie was also used to secure the 6-French open-ended catheter which was through the ureter and down in the bladder but separate from the nephrostomy tube and not through the nephrostomy tube lumen.  The patient was then cleaned up.  The nephrostomy tube was placed to gravity drainage.  Foley was placed to gravity drainage.  He was cleaned up.  A dressing was applied.  Then, he was placed supine, awakened, and taken to the recovery room in stable condition.  COMPLICATIONS:  None.  ESTIMATED BLOOD LOSS:  Not recorded.  Continuous irrigation was used and there was no significant blood loss noted from an operative prospective.  DRAINS:  Foley catheter, left nephrostomy tube and left nephroureteral stent.  SPECIMENS:  Kidney stones from the left, sent to office lab.  The patient will be admitted to overnight observation.  I will plan on getting the noncontrast CT scan because so many of these small stones were mobile and simply washed away to check the ureter.  Also, I wanted to ensure the patient ended up passing the right distal stone.          ______________________________ Jerilee Field,  MD     ME/MEDQ  D:  06/14/2012  T:  06/14/2012  Job:  [id number]

## 2012-06-16 ENCOUNTER — Inpatient Hospital Stay (HOSPITAL_COMMUNITY): Payer: Managed Care, Other (non HMO) | Admitting: Anesthesiology

## 2012-06-16 ENCOUNTER — Encounter (HOSPITAL_COMMUNITY): Payer: Self-pay | Admitting: Anesthesiology

## 2012-06-16 ENCOUNTER — Inpatient Hospital Stay (HOSPITAL_COMMUNITY): Payer: Managed Care, Other (non HMO)

## 2012-06-16 ENCOUNTER — Encounter (HOSPITAL_COMMUNITY)
Admission: RE | Disposition: A | Discharge status: Home / Self Care | Payer: Self-pay | Source: Ambulatory Visit | Attending: Urology

## 2012-06-16 HISTORY — PX: CYSTOSCOPY W/ URETERAL STENT PLACEMENT: SHX1429

## 2012-06-16 SURGERY — CYSTOSCOPY, WITH RETROGRADE PYELOGRAM AND URETERAL STENT INSERTION
Anesthesia: General | Laterality: Left | Wound class: Clean Contaminated

## 2012-06-16 MED ORDER — STERILE WATER FOR IRRIGATION IR SOLN
Status: DC | PRN
  Administered 2012-06-16: 3000 mL

## 2012-06-16 MED ORDER — FENTANYL CITRATE 0.05 MG/ML IJ SOLN
INTRAMUSCULAR | Status: DC | PRN
  Administered 2012-06-16 (×2): 50 ug via INTRAVENOUS

## 2012-06-16 MED ORDER — LACTATED RINGERS IV SOLN
INTRAVENOUS | Status: DC | PRN
  Administered 2012-06-16: 17:00:00 via INTRAVENOUS

## 2012-06-16 MED ORDER — BACITRACIN-NEOMYCIN-POLYMYXIN 400-5-5000 EX OINT: 1.0000 "application " | TOPICAL_OINTMENT | Freq: Three times a day (TID) | CUTANEOUS | Status: DC | PRN

## 2012-06-16 MED ORDER — SODIUM CHLORIDE 0.9 % IV SOLN
INTRAVENOUS | Status: DC
  Administered 2012-06-16 (×2): via INTRAVENOUS

## 2012-06-16 MED ORDER — DEXAMETHASONE SODIUM PHOSPHATE 10 MG/ML IJ SOLN
INTRAMUSCULAR | Status: DC | PRN
  Administered 2012-06-16: 10 mg via INTRAVENOUS

## 2012-06-16 MED ORDER — HYDROMORPHONE HCL PF 1 MG/ML IJ SOLN
0.2500 mg | INTRAMUSCULAR | Status: DC | PRN
  Administered 2012-06-16 (×2): 0.5 mg via INTRAVENOUS

## 2012-06-16 MED ORDER — MAGNESIUM HYDROXIDE 400 MG/5ML PO SUSP
30.0000 mL | Freq: Every day | ORAL | Status: DC
  Administered 2012-06-16 – 2012-06-17 (×2): 30 mL via ORAL
  Filled 2012-06-16 (×2): qty 30

## 2012-06-16 MED ORDER — PROPOFOL 10 MG/ML IV BOLUS
INTRAVENOUS | Status: DC | PRN
  Administered 2012-06-16: 200 mg via INTRAVENOUS

## 2012-06-16 MED ORDER — ONDANSETRON HCL 4 MG/2ML IJ SOLN
INTRAMUSCULAR | Status: DC | PRN
  Administered 2012-06-16: 4 mg via INTRAVENOUS

## 2012-06-16 MED ORDER — CIPROFLOXACIN IN D5W 400 MG/200ML IV SOLN
INTRAVENOUS | Status: DC | PRN
Start: 2012-06-16 — End: 2012-06-16
  Administered 2012-06-16: 400 mg via INTRAVENOUS

## 2012-06-16 MED ORDER — IOHEXOL 300 MG/ML  SOLN
INTRAMUSCULAR | Status: DC | PRN
  Administered 2012-06-16: 50 mL via INTRAVENOUS

## 2012-06-16 MED ORDER — LIDOCAINE HCL (CARDIAC) 20 MG/ML IV SOLN
INTRAVENOUS | Status: DC | PRN
  Administered 2012-06-16: 50 mg via INTRAVENOUS

## 2012-06-16 MED ORDER — SUCCINYLCHOLINE CHLORIDE 20 MG/ML IJ SOLN
INTRAMUSCULAR | Status: DC | PRN
  Administered 2012-06-16: 100 mg via INTRAVENOUS

## 2012-06-16 MED ORDER — MIDAZOLAM HCL 5 MG/5ML IJ SOLN
INTRAMUSCULAR | Status: DC | PRN
  Administered 2012-06-16: 2 mg via INTRAVENOUS

## 2012-06-16 SURGICAL SUPPLY — 16 items
ADAPTER CATH URET PLST 4-6FR (CATHETERS) ×2 IMPLANT
BAG URINE DRAINAGE (UROLOGICAL SUPPLIES) ×2 IMPLANT
BAG URO CATCHER STRL LF (DRAPE) ×2 IMPLANT
CATH FOLEY 2WAY SLVR  5CC 18FR (CATHETERS) ×1
CATH FOLEY 2WAY SLVR 5CC 18FR (CATHETERS) ×1 IMPLANT
CATH INTERMIT  6FR 70CM (CATHETERS) ×2 IMPLANT
CLOTH BEACON ORANGE TIMEOUT ST (SAFETY) ×2 IMPLANT
DRAPE CAMERA CLOSED 9X96 (DRAPES) ×2 IMPLANT
GLOVE BIOGEL M STRL SZ7.5 (GLOVE) ×2 IMPLANT
GOWN STRL NON-REIN LRG LVL3 (GOWN DISPOSABLE) ×2 IMPLANT
GOWN STRL REIN XL XLG (GOWN DISPOSABLE) ×2 IMPLANT
GUIDEWIRE STR DUAL SENSOR (WIRE) ×2 IMPLANT
MANIFOLD NEPTUNE II (INSTRUMENTS) ×2 IMPLANT
PACK CYSTO (CUSTOM PROCEDURE TRAY) ×2 IMPLANT
STENT CONTOUR 6FRX28X.038 (STENTS) ×2 IMPLANT
TUBING CONNECTING 10 (TUBING) ×2 IMPLANT

## 2012-06-16 NOTE — Anesthesia Preprocedure Evaluation (Signed)
Anesthesia Evaluation  Patient identified by MRN, date of birth, ID band Patient awake  General Assessment Comment:Ate full breakfast 9:15  Reviewed: Allergy & Precautions, H&P , NPO status , Patient's Chart, lab work & pertinent test results, reviewed documented beta blocker date and time   Airway Mallampati: II TM Distance: >3 FB Neck ROM: Full    Dental  (+) Teeth Intact and Dental Advisory Given   Pulmonary neg pulmonary ROS,  breath sounds clear to auscultation        Cardiovascular negative cardio ROS  Rhythm:Regular Rate:Normal  Denies cardiac symptoms   Neuro/Psych negative neurological ROS  negative psych ROS   GI/Hepatic negative GI ROS, Neg liver ROS,   Endo/Other  negative endocrine ROS  Renal/GU Kidney stone  negative genitourinary   Musculoskeletal negative musculoskeletal ROS (+)   Abdominal   Peds negative pediatric ROS (+)  Hematology negative hematology ROS (+)   Anesthesia Other Findings   Reproductive/Obstetrics negative OB ROS                           Anesthesia Physical Anesthesia Plan  ASA: I and Emergent  Anesthesia Plan: General   Post-op Pain Management:    Induction: Intravenous, Rapid sequence and Cricoid pressure planned  Airway Management Planned: Oral ETT  Additional Equipment:   Intra-op Plan:   Post-operative Plan: Extubation in OR  Informed Consent: I have reviewed the patients History and Physical, chart, labs and discussed the procedure including the risks, benefits and alternatives for the proposed anesthesia with the patient or authorized representative who has indicated his/her understanding and acceptance.   Dental advisory given  Plan Discussed with: CRNA and Surgeon  Anesthesia Plan Comments:         Anesthesia Quick Evaluation

## 2012-06-16 NOTE — Brief Op Note (Signed)
06/14/2012 - 06/16/2012  6:39 PM  PATIENT:  George Barton  36 y.o. male  PRE-OPERATIVE DIAGNOSIS:  left hydronephrosis, ureteral stone  POST-OPERATIVE DIAGNOSIS:  Left hydronephrosis, ureteral stone  PROCEDURE:  Procedure(s) (LRB): CYSTOSCOPY WITH RETROGRADE PYELOGRAM/URETERAL STENT PLACEMENT (Left)  SURGEON:  Surgeon(s) and Role:    * [name], MD - Primary   ANESTHESIA:   general  EBL:  Total I/O In: 940 [P.O.:240; I.V.:700] Out: 400 [Urine:400] Minimal EBL  DRAINS: 6 x 28 cm left JJ stent, 18 fr foley  LOCAL MEDICATIONS USED:  NONE  DICTATION: .Other Dictation: Dictation Number [id number]  PLAN OF CARE: Admit to inpatient   PATIENT DISPOSITION:  PACU - hemodynamically stable.   Delay start of Pharmacological VTE agent (>24hrs) due to surgical blood loss or risk of bleeding: yes

## 2012-06-16 NOTE — Anesthesia Postprocedure Evaluation (Signed)
  Anesthesia Post-op Note  Patient: George Barton  Procedure(s) Performed: Procedure(s) (LRB): CYSTOSCOPY WITH RETROGRADE PYELOGRAM/URETERAL STENT PLACEMENT (Left)  Patient Location: PACU  Anesthesia Type: General  Level of Consciousness: oriented and sedated  Airway and Oxygen Therapy: Patient Spontanous Breathing  Post-op Pain: mild  Post-op Assessment: Post-op Vital signs reviewed, Patient's Cardiovascular Status Stable, Respiratory Function Stable and Patent Airway  Post-op Vital Signs: stable  Complications: No apparent anesthesia complications

## 2012-06-16 NOTE — Anesthesia Procedure Notes (Addendum)
Date/Time: 06/16/2012 5:30 PM Performed by: Carmelia Roller R   Procedure Name: Intubation Date/Time: 06/16/2012 5:30 PM Performed by: Doran Clay Pre-anesthesia Checklist: Patient identified, Timeout performed, Emergency Drugs available, Suction available and Patient being monitored Patient Re-evaluated:Patient Re-evaluated prior to inductionOxygen Delivery Method: Circle system utilized Preoxygenation: Pre-oxygenation with 100% oxygen Intubation Type: IV induction Laryngoscope Size: Mac and 4 Grade View: Grade I Tube type: Oral Number of attempts: 1 Airway Equipment and Method: Stylet Placement Confirmation: ETT inserted through vocal cords under direct vision,  breath sounds checked- equal and bilateral and positive ETCO2 Secured at: 21 cm Tube secured with: Tape Dental Injury: Teeth and Oropharynx as per pre-operative assessment

## 2012-06-16 NOTE — Progress Notes (Signed)
Patient ID: George Barton, male   DOB: May 18, 1976, 36 y.o.   MRN: [id number]  Pt did fine overnight but developed some crampy LLQ pain that radiated into left testicle. He has some mild hematuria without clots. No flank pain. Flank drainiange slowing down. Passing flatus but no BM. He took Microbiologist and and  PE: AFVSS NAD A& ANDOx3 Abd - soft, NT, ND GU - left testicle palpably normal. No bruising in inguinal area or scrotum. No tenderness or mass. Absent right testicle.  Left Flank dressing dry  Imp - POD#2 left PCNL, all tubes out Plan - will d/c home at lunch if pain controlled, tolerating regular diet.

## 2012-06-16 NOTE — Progress Notes (Signed)
Patient ID: George Barton, male   DOB: September 20, 1976, 36 y.o.   MRN: [id number]  He's developed severe LLQ pain again. He passed a long stringy blood clot, but his urine otherwise looks clear. He has continued left flank drainage. Will need CT A/P. May need a ureteral stent.

## 2012-06-16 NOTE — Transfer of Care (Signed)
Immediate Anesthesia Transfer of Care Note  Patient: George Barton  Procedure(s) Performed: Procedure(s) (LRB): CYSTOSCOPY WITH RETROGRADE PYELOGRAM/URETERAL STENT PLACEMENT (Left)  Patient Location: PACU  Anesthesia Type: General  Level of Consciousness: sedated  Airway & Oxygen Therapy: Patient Spontanous Breathing and Patient connected to face mask oxygen  Post-op Assessment: Report given to PACU RN and Post -op Vital signs reviewed and stable  Post vital signs: Reviewed and stable  Complications: No apparent anesthesia complications

## 2012-06-16 NOTE — Progress Notes (Signed)
Patient ID: George Barton, male   DOB: 05/27/1976, 36 y.o.   MRN: [id number]  Discussed CT with patient. Fragments in left ureter and hydronephrosis on left. Also probably some clots. He has continued flank drainage and passed a clot. I discussed with the patient the nature, potential benefits, risks and alternatives to cystoscopy with left RGP and left stent placement, including side effects of the proposed treatment, the likelihood of the patient achieving the goals of the procedure, and any potential problems that might occur during the procedure or recuperation. All questions answered. Patient elects to proceed.

## 2012-06-17 ENCOUNTER — Encounter (HOSPITAL_COMMUNITY): Payer: Self-pay | Admitting: Urology

## 2012-06-17 MED ORDER — PHENOL 1.4 % MT LIQD
1.0000 | OROMUCOSAL | Status: DC | PRN
  Filled 2012-06-17: qty 177

## 2012-06-17 MED ORDER — MENTHOL 3 MG MT LOZG
1.0000 | LOZENGE | OROMUCOSAL | Status: DC | PRN
  Filled 2012-06-17: qty 9

## 2012-06-17 NOTE — Progress Notes (Signed)
Patient ID: George Barton, male   DOB: [birth year], 36 y.o.   MRN: [id number]  Sleeping comfortably. No pain.   Gu - urine in foley clear to light pink Flank - left dressing dry. Put in last night around [place].   D/c foley Home today

## 2012-06-17 NOTE — Op Note (Signed)
NAMEABHISHEK, LEVESQUE NO.:  [id number]  MEDICAL RECORD NO.:  [id number]  LOCATION:  1415                         FACILITY:  [facility]  PHYSICIAN:  Jerilee Field, MD   DATE OF BIRTH:  06-10-1976  DATE OF PROCEDURE: DATE OF DISCHARGE:                              OPERATIVE REPORT   PREOPERATIVE DIAGNOSES:  Left ureteral stones and left hydronephrosis.  POSTOPERATIVE DIAGNOSES:  Left ureteral stones and left hydronephrosis.  PROCEDURE:  Cystoscopy, left retrograde pyelogram, and left ureteral stent placement.  SURGEON:  Jerilee Field, MD  TYPE OF ANESTHESIA:  General.  INDICATION FOR PROCEDURE:  Mr. Graves is a 36 year old male who underwent a left percutaneous nephrolithotomy 48 hours ago.  After the ureteral catheter and nephrostomy tube were removed, he developed severe colicky left lower quadrant and flank pain.  He passed several blood clots.  He also had continued copious output from the left nephrostomy tract.  Therefore, CT scan was obtained which showed several small fragments in the mid and distal ureter along with likely some blood clots or higher density material with left hydronephrosis.  I discussed the findings with the patient, the nature, risks, benefits, and alternatives to cystoscopy, left retrograde, and left ureteral stent placement.  All questions were answered and he elected to proceed.  FINDINGS:  On left retrograde, there was a large filling defect in the left distal ureter consistent with a clot, also in the left mid ureter consistent with a clot and stone fragments.  The upper, middle, and lower collecting system all appeared intact.  There was no extravasation from the UPJ or proximal ureter.  There may have been some extravasation out of a posterior lower pole calyx and the nephrostomy tract but this was small.  DESCRIPTION OF PROCEDURE:  After consent was obtained, the patient was brought to the operating room.  A time-out  was performed to confirm the patient and procedure.  After adequate anesthesia, he was placed in lithotomy position, prepped and draped in the usual sterile fashion.  A rigid cystoscope was passed per urethra.  The bladder was inspected and noted to be normal.  Protruding from the left ureteral orifice was a blood clot with some small fragments of stone affixed to it.  A 6-French open-ended catheter was placed into the left ureteral orifice and left retrograde injection of contrast was obtained with previously the findings dictated.  A Sensor wire was advanced up into the left kidney. The 6-French open-ended catheter was advanced into the proximal ureter and the wire removed.  A second confirmatory retrograde was obtained with retrograde injection of contrast to ensure the wire was in the lumen of the ureter and renal pelvis which it was.  The wire was readvanced and coiled in the upper pole and the 6-French open-ended removed.  Over the wire, a 6 x 28 cm ureteral stent was passed.  The wire was removed.  The good coil was seen in the renal pelvis and a good coil in the bladder.  The patient was then awakened and taken to the recovery room in stable condition.  After the stent was placed, the stent immediately began draining a copious amount of urine with  light hematuria and no clots.  The bladder was left full and the scope was removed.  An 18-French Foley was placed and left to gravity drainage. The patient was then awakened and taken to recovery room in stable condition.  COMPLICATIONS:  None.  DRAINS:  A 18-French Foley and a 6 x 28 cm left ureteral stent.  The patient will be admitted overnight for observation.          ______________________________ Jerilee Field, MD     ME/MEDQ  D:  06/16/2012  T:  06/17/2012  Job:  [id number]

## 2012-06-17 NOTE — Discharge Summary (Signed)
Physician Discharge Summary  Patient ID: George Barton MRN: [id number] DOB/AGE: [birth year] 36 y.o.  Admit date: 06/14/2012 Discharge date: 06/17/2012  Admission Diagnoses: Left nephrolithiasis  Discharge Diagnoses:  Left nephrolithiasis  Discharged Condition: good  Hospital Course: The patient was admitted to the hospital following left percutaneous nephrolithotomy. On postop day 1 the nephrostomy tube and nephroureteral stent that was removed. He had continuous drainage of urine from the left flank percutaneous site and developed severe left flank and left lower quadrant pain by postop day #2. CT scan of the abdomen and pelvis revealed several small fragments and blood clots in the ureter with left hydronephrosis. He was taken to the OR postop day 2 for cystoscopy with left retrograde pyelogram and left ureteral stent placement. This resolved the flank drainage and pain. On postop day 3 he has no pain, is voiding on his, is ambulating and tolerating a regular diet. Also the flank nephrostomy site has dried up.  Consults: None  Significant Diagnostic Studies: radiology: CT scan  Treatments: surgery: 1 left percutaneous nephrolithotomy, 2 cystoscopy left retrograde and left ureteral stent placement  Discharge Exam: Blood pressure 123/73, pulse 61, temperature 97.9 F (36.6 C), temperature source Oral, resp. rate 16, height 6\' 3"  (1.905 m), weight 90.719 kg (200 lb), SpO2 100.00%. He is in no acute distress. Alert and oriented x3 Left flank dressing dry Abdomen soft nontender Extremities no edema  Disposition: 01-Home or Self Care   Medication List  As of 06/17/2012 10:47 AM   STOP taking these medications         ibuprofen 200 MG tablet         TAKE these medications         docusate sodium 100 MG capsule   Commonly known as: COLACE   Take 1 capsule (100 mg total) by mouth 2 (two) times daily.      mometasone 50 MCG/ACT nasal spray   Commonly known as: NASONEX   Place 2  sprays into the nose daily.      oxyCODONE-acetaminophen 5-325 MG per tablet   Commonly known as: PERCOCET   Take 1 tablet by mouth every 4 (four) hours as needed for pain.      potassium citrate 10 MEQ (1080 MG) SR tablet   Commonly known as: UROCIT-K   Take 10 mEq by mouth 3 (three) times daily with meals.      sulfamethoxazole-trimethoprim 800-160 MG per tablet   Commonly known as: BACTRIM DS   Take 1 tablet by mouth 2 (two) times daily.           Follow-up Information    Follow up with [name], MD. (June 21, 2012; 3:00 PM)    Contact information:   [address] [facility] [address] [facility] [place] [address] [phone]          Signed: [name] 06/17/2012, 10:47 AM

## 2012-06-18 LAB — TYPE AND SCREEN
ABO/RH(D): A POS
Unit division: 0

## 2012-06-19 ENCOUNTER — Emergency Department (HOSPITAL_COMMUNITY)
Admission: EM | Admit: 2012-06-19 | Discharge: 2012-06-19 | Disposition: A | Discharge status: Home / Self Care | Payer: Managed Care, Other (non HMO) | Attending: Emergency Medicine | Admitting: Emergency Medicine

## 2012-06-19 ENCOUNTER — Encounter (HOSPITAL_COMMUNITY): Payer: Self-pay | Admitting: *Deleted

## 2012-06-19 ENCOUNTER — Emergency Department (HOSPITAL_COMMUNITY): Payer: Managed Care, Other (non HMO)

## 2012-06-19 DIAGNOSIS — R109 Unspecified abdominal pain: Secondary | ICD-10-CM | POA: Insufficient documentation

## 2012-06-19 DIAGNOSIS — R112 Nausea with vomiting, unspecified: Secondary | ICD-10-CM | POA: Insufficient documentation

## 2012-06-19 HISTORY — DX: Calculus of kidney: N20.0

## 2012-06-19 LAB — CBC WITH DIFFERENTIAL/PLATELET
Basophils Relative: 0 % (ref 0–1)
Eosinophils Absolute: 0.2 10*3/uL (ref 0.0–0.7)
HCT: 35.3 % — ABNORMAL LOW (ref 39.0–52.0)
Hemoglobin: 12.1 g/dL — ABNORMAL LOW (ref 13.0–17.0)
Lymphs Abs: 0.7 10*3/uL (ref 0.7–4.0)
MCH: 28.4 pg (ref 26.0–34.0)
MCHC: 34.3 g/dL (ref 30.0–36.0)
Monocytes Absolute: 0.4 10*3/uL (ref 0.1–1.0)
Monocytes Relative: 5 % (ref 3–12)
Neutrophils Relative %: 83 % — ABNORMAL HIGH (ref 43–77)
RBC: 4.26 MIL/uL (ref 4.22–5.81)

## 2012-06-19 LAB — URINE MICROSCOPIC-ADD ON

## 2012-06-19 LAB — URINALYSIS, ROUTINE W REFLEX MICROSCOPIC
Bilirubin Urine: NEGATIVE
Glucose, UA: NEGATIVE mg/dL
Specific Gravity, Urine: 1.017 (ref 1.005–1.030)
pH: 7.5 (ref 5.0–8.0)

## 2012-06-19 LAB — POCT I-STAT, CHEM 8
BUN: 20 mg/dL (ref 6–23)
Chloride: 103 mEq/L (ref 96–112)
Creatinine, Ser: 1.3 mg/dL (ref 0.50–1.35)
Glucose, Bld: 105 mg/dL — ABNORMAL HIGH (ref 70–99)
Potassium: 4.7 mEq/L (ref 3.5–5.1)
Sodium: 138 mEq/L (ref 135–145)

## 2012-06-19 MED ORDER — SODIUM CHLORIDE 0.9 % IV SOLN
INTRAVENOUS | Status: DC
  Administered 2012-06-19: 16:00:00 via INTRAVENOUS

## 2012-06-19 MED ORDER — HYDROMORPHONE HCL PF 1 MG/ML IJ SOLN
1.0000 mg | Freq: Once | INTRAMUSCULAR | Status: DC
  Filled 2012-06-19: qty 1

## 2012-06-19 MED ORDER — ONDANSETRON HCL 4 MG/2ML IJ SOLN
4.0000 mg | Freq: Once | INTRAMUSCULAR | Status: DC
  Filled 2012-06-19: qty 2

## 2012-06-19 MED ORDER — HYDROMORPHONE HCL 4 MG PO TABS: 4.0000 mg | ORAL_TABLET | ORAL | Status: AC | PRN

## 2012-06-19 MED ORDER — PROMETHAZINE HCL 25 MG PO TABS: 25.0000 mg | ORAL_TABLET | Freq: Four times a day (QID) | ORAL | Status: DC | PRN

## 2012-06-19 NOTE — ED Notes (Signed)
Patient is alert and oriented x3.  He had an emergency stent put in on Tuesday and was found to have A blood clot on Thursday with emergency stent placement.  He was given pain medication and was notified  To come back if it did not help.  Today he is complaining of severe left side flank pain.  Pain currently rated At 10 of 10.  He confirms intermittent nausea with dizziness.  Denies vomiting

## 2012-06-19 NOTE — ED Provider Notes (Signed)
History     CSN: [id number]  Arrival date & time 06/19/12  1308   First MD Initiated Contact with Patient 06/19/12 1320      Chief Complaint  Patient presents with  . Flank Pain    right side    (Consider location/radiation/quality/duration/timing/severity/associated sxs/prior treatment) Patient is a 36 y.o. male presenting with flank pain. The history is provided by the patient.  Flank Pain Associated symptoms include abdominal pain. Pertinent negatives include no headaches.  has hx of ureteral stent placement for staghorn calculus and then removal. Now c/o sudden onset of left flank pain radiating into groin.   ( same side as stent. ) + n/v. No fever or chills.  Level 5 Caveat for urgent need for intervention.   Past Medical History  Diagnosis Date  . Kidney calculi   . Shingles 06-01-12    2 yrs ago- outbreak around breast/back  . Hernia 06-01-12    in past-corrected self  . Elbow fracture, left 06-01-12    past hx.pinning-removed  . Kidney stones     Past Surgical History  Procedure Date  . Appendectomy   . Testicle surgery 06-01-12    as infant -undescended testicle  . Nephrolithotomy 06/14/2012    Procedure: NEPHROLITHOTOMY PERCUTANEOUS;  Surgeon: [name], MD;  Location: [facility];  Service: Urology;  Laterality: Left;  . Cystoscopy w/ ureteral stent placement 06/16/2012    Procedure: CYSTOSCOPY WITH RETROGRADE PYELOGRAM/URETERAL STENT PLACEMENT;  Surgeon: [name], MD;  Location: [facility];  Service: Urology;  Laterality: Left;    No family history on file.  History  Substance Use Topics  . Smoking status: Never Smoker   . Smokeless tobacco: Not on file  . Alcohol Use: Yes     rare-social      Review of Systems  Constitutional: Negative for fever and chills.  Gastrointestinal: Positive for nausea, vomiting and abdominal pain.  Genitourinary: Positive for flank pain.  Neurological: Negative for headaches.  Psychiatric/Behavioral:  Negative for confusion.  All other systems reviewed and are negative.    Allergies  Review of patient's allergies indicates no known allergies.  Home Medications   Current Outpatient Rx  Name Route Sig Dispense Refill  . DOCUSATE SODIUM 100 MG PO CAPS Oral Take 1 capsule (100 mg total) by mouth 2 (two) times daily. 10 capsule 0  . MOMETASONE FUROATE 50 MCG/ACT NA SUSP Nasal Place 2 sprays into the nose daily.    . OXYCODONE-ACETAMINOPHEN 5-325 MG PO TABS Oral Take 1 tablet by mouth every 4 (four) hours as needed for pain. 30 tablet 0  . POTASSIUM CITRATE ER 10 MEQ (1080 MG) PO TBCR Oral Take 10 mEq by mouth 3 (three) times daily with meals.    . SULFAMETHOXAZOLE-TMP DS 800-160 MG PO TABS Oral Take 1 tablet by mouth 2 (two) times daily. 14 tablet 0    BP 156/88  Pulse 98  Temp 98.2 F (36.8 C) (Oral)  Resp 22  Ht 6\' 3"  (1.905 m)  Wt 200 lb (90.719 kg)  BMI 25.00 kg/m2  SpO2 100%  Physical Exam  Nursing note and vitals reviewed. Constitutional: He is oriented to person, place, and time. He appears well-developed and well-nourished. No distress.       Uncomfortable appearing.  HENT:  Head: Normocephalic and atraumatic.  Eyes: Conjunctivae are normal.  Neck: Normal range of motion. Neck supple.  Cardiovascular: Normal rate.   No murmur heard. Pulmonary/Chest: Effort normal and breath sounds normal. No respiratory  distress.  Abdominal: Soft. He exhibits no distension. There is tenderness.       Mild llq ttp, no peritoneal signs  Musculoskeletal: Normal range of motion.  Neurological: He is alert and oriented to person, place, and time.  Skin: Skin is warm and dry.  Psychiatric: He has a normal mood and affect. Thought content normal.    ED Course  Procedures (including critical care time) 31 y male with sxs c/w ureteral colic. S/p stent removal recently.     Labs Reviewed  CBC WITH DIFFERENTIAL  URINALYSIS, ROUTINE W REFLEX MICROSCOPIC   No results found.   No  diagnosis found.  3:16 PM Pain resolved Discussed with dr. Vernie Ammons. He agrees with d/c and f/u in office.  MDM  Left flank pain        Cheri Guppy, MD 06/19/12 [id number]

## 2012-06-27 NOTE — Anesthesia Postprocedure Evaluation (Signed)
  Anesthesia Post-op Note  Patient: George Barton  Procedure(s) Performed: Procedure(s) (LRB): CYSTOSCOPY WITH RETROGRADE PYELOGRAM/URETERAL STENT PLACEMENT (Left)  Patient Location: PACU  Anesthesia Type: General  Level of Consciousness: oriented and sedated  Airway and Oxygen Therapy: Patient Spontanous Breathing  Post-op Pain: mild  Post-op Assessment: Post-op Vital signs reviewed, Patient's Cardiovascular Status Stable, Respiratory Function Stable and Patent Airway  Post-op Vital Signs: stable  Complications: No apparent anesthesia complications 

## 2012-10-17 ENCOUNTER — Other Ambulatory Visit: Payer: Self-pay | Admitting: Urology

## 2012-10-19 ENCOUNTER — Encounter (HOSPITAL_COMMUNITY): Payer: Self-pay | Admitting: Pharmacy Technician

## 2012-10-19 ENCOUNTER — Encounter (HOSPITAL_COMMUNITY): Payer: Self-pay | Admitting: *Deleted

## 2012-10-19 NOTE — Pre-Procedure Instructions (Signed)
Asked to bring blue folder the day of the procedure,insurance card,I.D. driver's license,wear comfortable clothing and have a driver for the day. Asked not to take Advil,Motrin,Ibuprofen,Aleve or any NSAIDS, Aspirin, or Toradol for 72 hours prior to procedure,  No vitamins or herbal medications 7 days prior to procedure. Instructed to take laxative per doctor's office instructions and eat a light dinner the evening before procedure.   To arrive at 1530 for lithotripsy procedure. 

## 2012-10-24 ENCOUNTER — Encounter (HOSPITAL_COMMUNITY)
Admission: RE | Disposition: A | Discharge status: Home / Self Care | Payer: Self-pay | Source: Ambulatory Visit | Attending: Urology

## 2012-10-24 ENCOUNTER — Ambulatory Visit (HOSPITAL_COMMUNITY): Payer: Managed Care, Other (non HMO)

## 2012-10-24 ENCOUNTER — Encounter (HOSPITAL_COMMUNITY): Payer: Self-pay | Admitting: *Deleted

## 2012-10-24 ENCOUNTER — Ambulatory Visit (HOSPITAL_COMMUNITY)
Admission: RE | Admit: 2012-10-24 | Discharge: 2012-10-24 | Disposition: A | Discharge status: Home / Self Care | Payer: Managed Care, Other (non HMO) | Source: Ambulatory Visit | Attending: Urology | Admitting: Urology

## 2012-10-24 DIAGNOSIS — N200 Calculus of kidney: Secondary | ICD-10-CM | POA: Insufficient documentation

## 2012-10-24 SURGERY — LITHOTRIPSY, ESWL
Anesthesia: LOCAL | Laterality: Left

## 2012-10-24 MED ORDER — DIAZEPAM 5 MG PO TABS
10.0000 mg | ORAL_TABLET | ORAL | Status: AC
  Administered 2012-10-24: 10 mg via ORAL
  Filled 2012-10-24: qty 2

## 2012-10-24 MED ORDER — CIPROFLOXACIN HCL 500 MG PO TABS
500.0000 mg | ORAL_TABLET | ORAL | Status: AC
  Administered 2012-10-24: 500 mg via ORAL
  Filled 2012-10-24: qty 1

## 2012-10-24 MED ORDER — SODIUM CHLORIDE 0.9 % IV SOLN
INTRAVENOUS | Status: DC
  Administered 2012-10-24: 17:00:00 via INTRAVENOUS

## 2012-10-24 MED ORDER — OXYCODONE-ACETAMINOPHEN 5-325 MG PO TABS: 1.0000 | ORAL_TABLET | ORAL | Status: AC | PRN

## 2012-10-24 MED ORDER — CIPROFLOXACIN HCL 500 MG PO TABS: 500.0000 mg | ORAL_TABLET | Freq: Two times a day (BID) | ORAL | Status: AC

## 2012-10-24 MED ORDER — DIPHENHYDRAMINE HCL 25 MG PO CAPS
25.0000 mg | ORAL_CAPSULE | ORAL | Status: AC
  Administered 2012-10-24: 25 mg via ORAL
  Filled 2012-10-24: qty 1

## 2012-10-24 NOTE — Interval H&P Note (Signed)
History and Physical Interval Note:  10/24/2012 5:30 PM  George Barton  has presented today for surgery, with the diagnosis of Left Nephrolithiasis  The various methods of treatment have been discussed with the patient and family. After consideration of risks, benefits and other options for treatment, the patient has consented to  Procedure(s) (LRB) with comments: EXTRACORPOREAL SHOCK WAVE LITHOTRIPSY (ESWL) (Left) as a surgical intervention .  The patient's history has been reviewed, patient examined, no change in status, stable for surgery.  I have reviewed the patient's chart and labs.  Questions were answered to the patient's satisfaction.     [name]

## 2012-10-24 NOTE — Brief Op Note (Signed)
10/24/2012  6:14 PM  PATIENT:  George Barton  36 y.o. male  PRE-OPERATIVE DIAGNOSIS:  Left Nephrolithiasis  POST-OPERATIVE DIAGNOSIS:  * No post-op diagnosis entered *  PROCEDURE:  Procedure(s) (LRB) with comments: EXTRACORPOREAL SHOCK WAVE LITHOTRIPSY (ESWL) (Left)  SURGEON:  Surgeon(s) and Role:    * [name], MD - Primary   PATIENT DISPOSITION:  PACU - hemodynamically stable.   Delay start of Pharmacological VTE agent (>24hrs) due to surgical blood loss or risk of bleeding: yes

## 2012-10-24 NOTE — Progress Notes (Signed)
Left flank is reddened without any skin breakdown s/p ESWL.

## 2012-10-24 NOTE — H&P (Signed)
History of Present Illness        F/u nephrolithiasis.   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 KUB revealed the left staghorn but no other stones. I revealed the KUB and CT images. Pt UA cleared and he is having no further flank pain. His urine is clear.   Has passed several stones (a group of tiny stones) 4-5 yrs ago. He has never needed stone surgery.   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 He was started on 10 mEq K Citrate TID.   June 14, 2012 patient underwent left PCNL. Stones == Ca Oxalate. He required cystoscopy with a left ureteral stent placement on postop day #2 due to obstruction from clots. He did well and was discharged postop day #3. He went to back to the ER postop day #5 with severe left flank pain. CT abdomen and pelvis was obtained. I reviewed all the images. There are two or three small fragments in the LLP.   Cysto stent removal in office Aug 2013.   Interval Hx He has had no dysuria or gross hematuria. he has had no flank pain. He passed a small stone about the size of a piece of rice about a month ago.    Imaging: Renal U/S - I reviewed all the images, see to tech sheet for more details, comparison CT may 2013 and KUB below, findings: small cyst in right kidney with hyperechoic area similar to CT. A stone hyperechoic area about 6 mm in LLP. No hydronephrosis or mass in either kidney.  KUB - normal bones, Normal bowel gas. 6 mm stone in LLP.   Past Medical History Problems  1. History of  Nephrolithiasis V13.01  Surgical History Problems  1. History of  Appendectomy 2. History of  Cystoscopy With Insertion Of Ureteral Stent Left 3. History of  Percutaneous Lithotomy For Stone Over 2cm.  Current Meds 1. Amoxicillin 500 MG Oral Capsule;  Therapy: 26Mar2013 to 2. HYDROmorphone HCl 4 MG Oral Tablet; Therapy: 14Jul2013 to 3. Potassium Chloride Crys ER 10 MEQ Oral Tablet Extended Release; Therapy: 10Jun2013 to 4. Potassium Citrate ER 10 MEQ (1080 MG) Oral Tablet Extended Release; TAKE 1 TABLET TID  with meals; Therapy: 10Jun2013 to (Evaluate:05Jun2014)  Requested for: 10Jun2013; Last  Rx:10Jun2013 5. Stool Softener CAPS; Therapy: (Recorded:16Jul2013) to 6. Sulfamethoxazole-TMP DS 800-160 MG Oral Tablet; Therapy: 12Jul2013 to  Allergies Medication  1. No Known Drug Allergies  Family History Problems  1. Family history of  Family Health Status Number Of Children 1 daughter  Social History Problems  1. Caffeine Use 2. Never A Smoker 3. 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 History of  Alcohol Use 3-4 cups daily 5. History of  Tobacco Use 305.1  Review of Systems Genitourinary, constitutional, skin, eye, otolaryngeal, hematologic/lymphatic, cardiovascular, pulmonary, endocrine, musculoskeletal, gastrointestinal, neurological and psychiatric system(s) were reviewed and pertinent findings if present are noted.    Vitals Vital Signs [Data Includes: Last 1 Day]  08Nov2013 03:54PM  Blood Pressure: 117 / 74 Temperature: 98.5 F Heart Rate: 75  Physical Exam Constitutional: Well nourished and well developed . No acute distress.  Pulmonary: No respiratory distress and normal respiratory rhythm and effort.  Cardiovascular: Heart rate and rhythm are normal . No peripheral edema.  Neuro/Psych:. Mood and  affect are appropriate.    Results/Data Urine [Data Includes: Last 1 Day]   08Nov2013  COLOR YELLOW   APPEARANCE CLEAR   SPECIFIC GRAVITY <1.005   pH 6.5   GLUCOSE NEG mg/dL  BILIRUBIN NEG   KETONE NEG mg/dL  BLOOD TRACE   PROTEIN NEG mg/dL  UROBILINOGEN 0.2 mg/dL  NITRITE NEG   LEUKOCYTE ESTERASE NEG   SQUAMOUS EPITHELIAL/HPF RARE   WBC NONE SEEN WBC/hpf  RBC 0-2 RBC/hpf  BACTERIA NONE SEEN   CRYSTALS NONE SEEN    CASTS NONE SEEN    Assessment Assessed  1. Nephrolithiasis 592.0  Plan Health Maintenance (V70.0)  1. UA With REFLEX  Done: 08Nov2013 03:00PM Nephrolithiasis (592.0)  2. Follow-up Schedule Surgery Office  Follow-up  Requested for: 08Nov2013  Discussion/Summary  Discussed LLP stones. Discussed nature R/B of surveillance with serial imaging, URS or ESWL. All questions answered. He elects to proceed with ESWL.    Signatures Electronically signed by : Jerilee Field, M.D.; Oct 14 2012  4:28PM

## 2013-05-26 NOTE — Telephone Encounter (Signed)
e

## 2013-10-03 IMAGING — CT CT ABD-PELV W/O CM
2 of 3 series · 17 of 46 positions shown, 19 images · non-contrast
Comparison: 04/11/2012

CLINICAL DATA: Nephrolithiasis.  Evaluate residual calculus burden.

CT ABDOMEN AND PELVIS WITHOUT CONTRAST
TECHNIQUE: Multidetector CT imaging of the abdomen and pelvis was
performed following the standard protocol without intravenous
contrast.

[Series 2: stone under (id) w prev · axial · 0.74mm/px · z∈[-314,+96]mm · 14 of 96 slices shown, 16 images]
[im 7/96  soft-tissue]
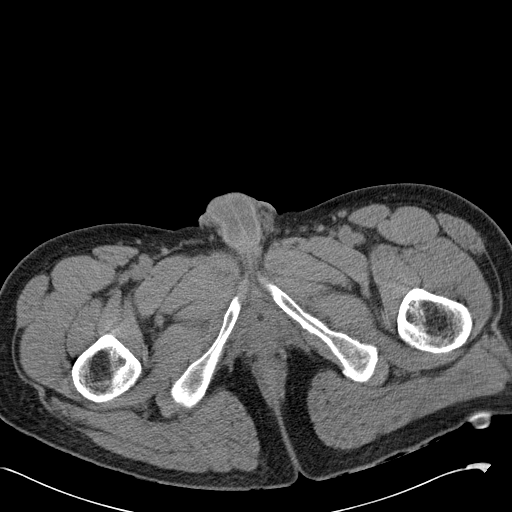
[im 7/96  bone]
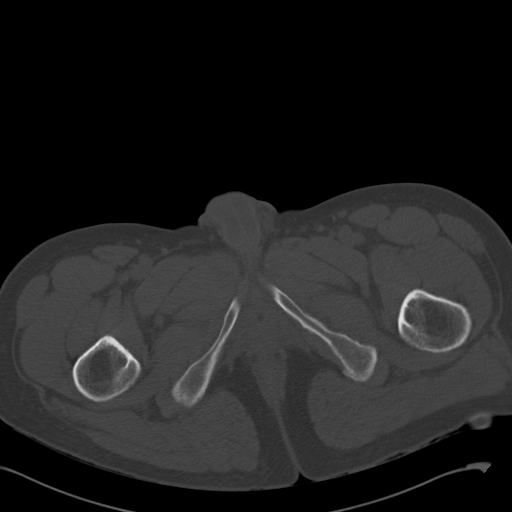
[im 13/96  soft-tissue]
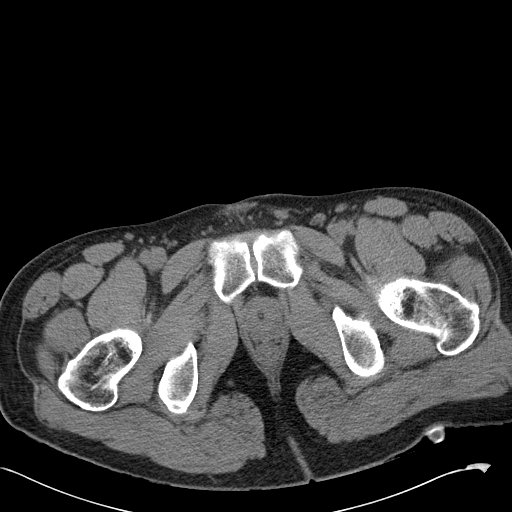
[im 19/96  soft-tissue]
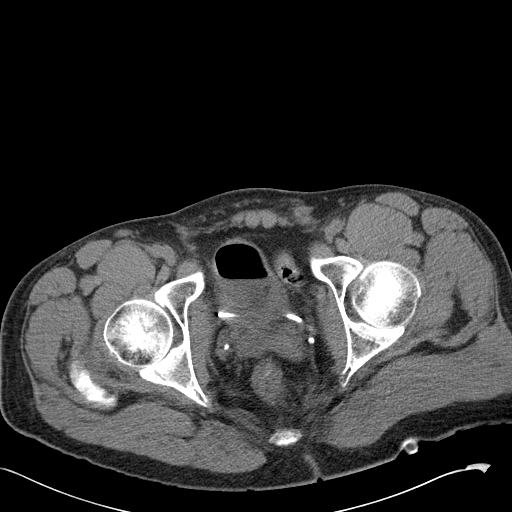
[im 25/96  soft-tissue]
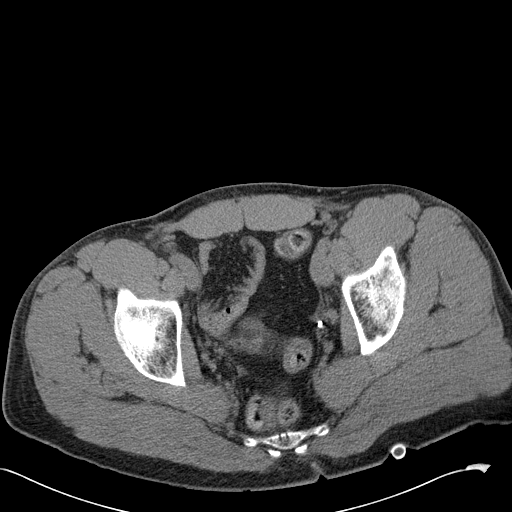
[im 31/96  soft-tissue]
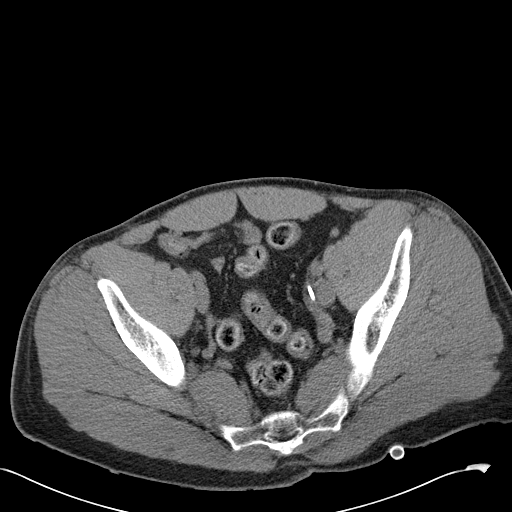
[im 37/96  soft-tissue]
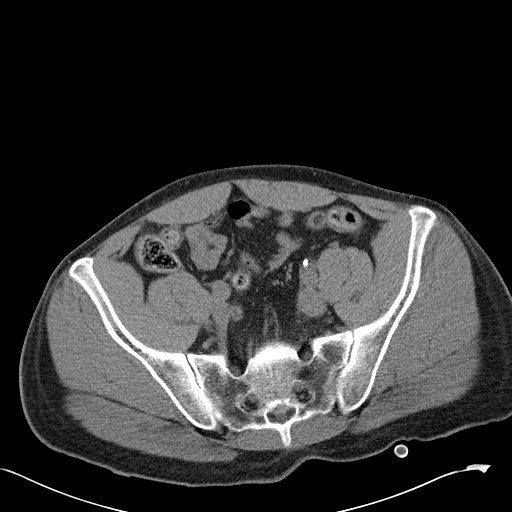
[im 43/96  soft-tissue]
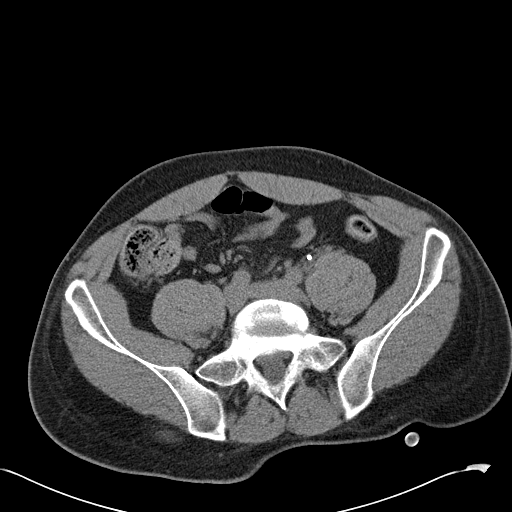
[im 53/96  soft-tissue]
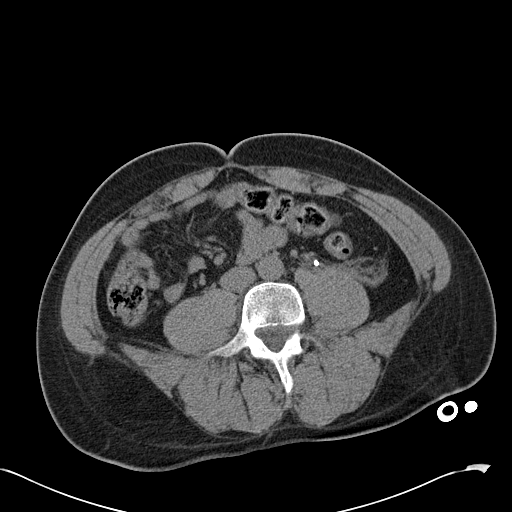
[im 59/96  soft-tissue]
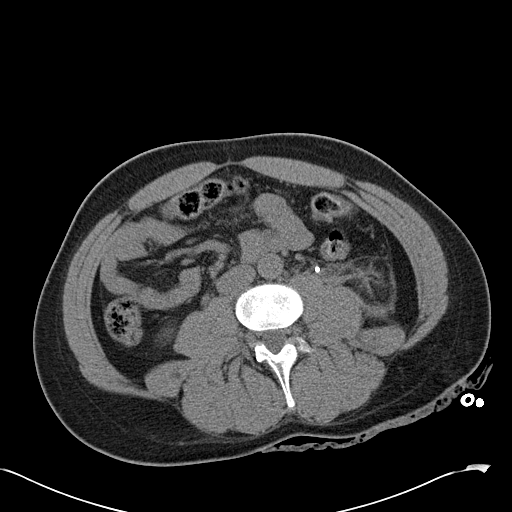
[im 59/96  bone]
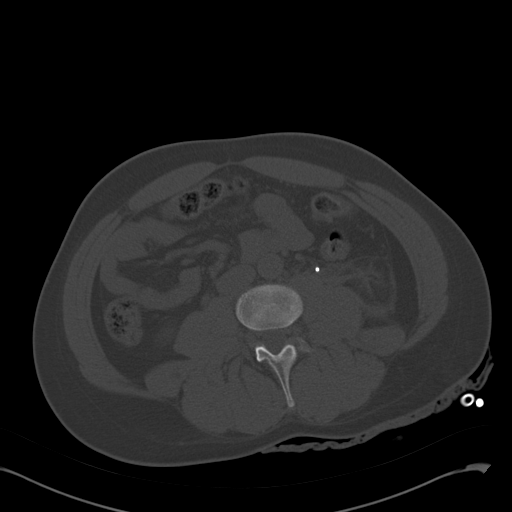
[im 65/96  soft-tissue]
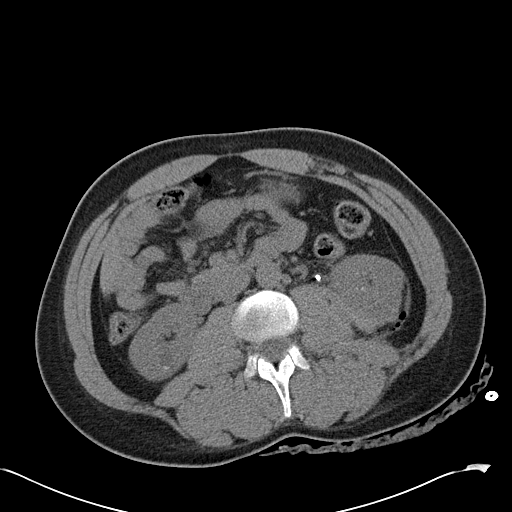
[im 71/96  soft-tissue]
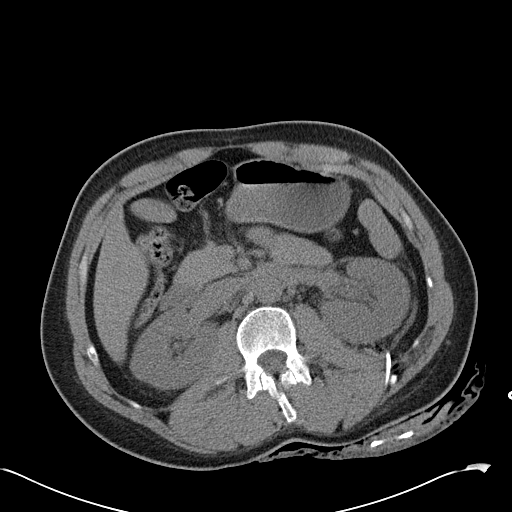
[im 77/96  soft-tissue]
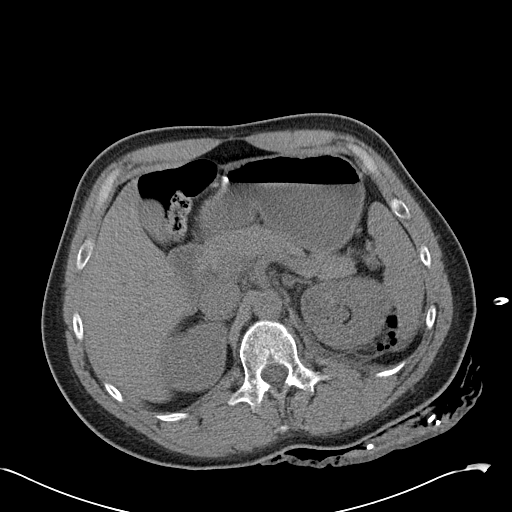
[im 83/96  soft-tissue]
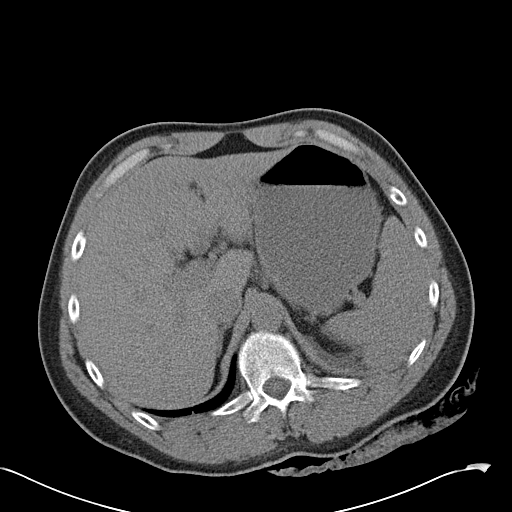
[im 89/96  soft-tissue]
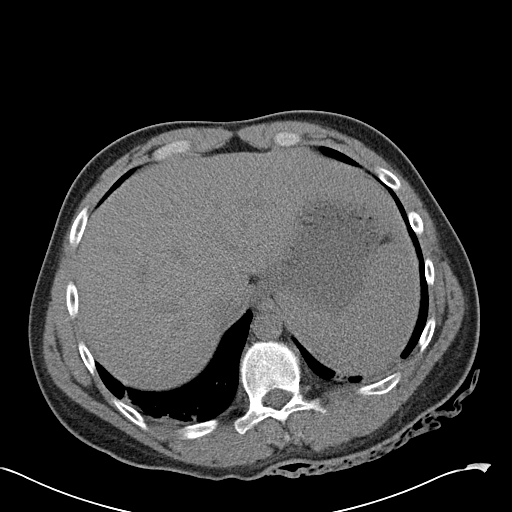

[Series 602: cor · coronal · 0.93mm/px · 3 of 105 slices shown]
[im 35/105  soft-tissue]
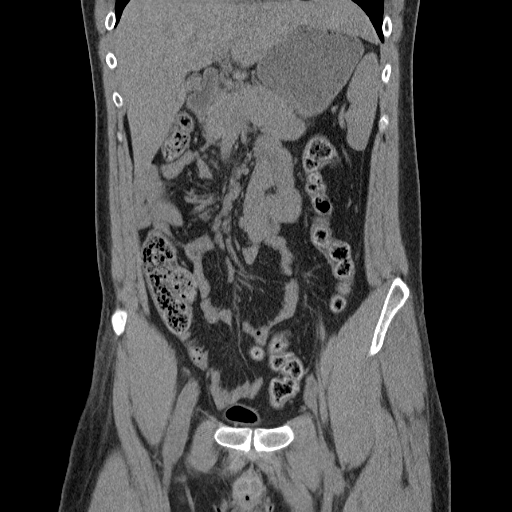
[im 47/105  soft-tissue]
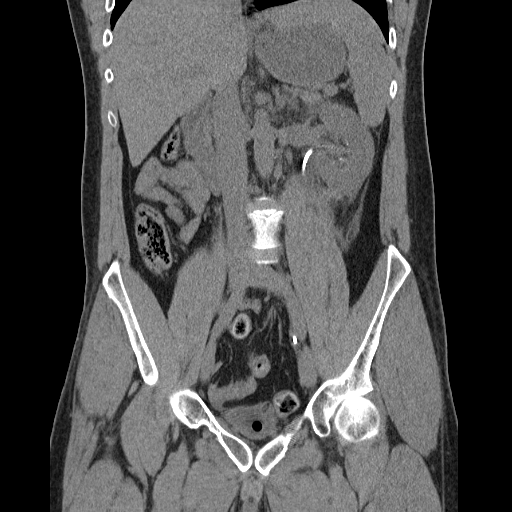
[im 58/105  soft-tissue]
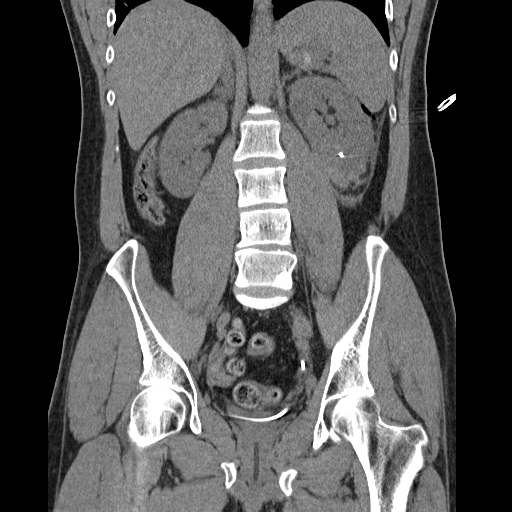

[17 of 46 positions shown; findings below may reference images not displayed]

FINDINGS: Previously visualized right ureteral calculus has
resolved.  Low density lesion in the lower pole of the right kidney
with a peripheral calcification is stable.

Postoperative changes from left sided percutaneous lithotomy are
now present.  Most of the calculus burden in the left kidney has
been removed.  Small fragments remain.  The largest fragment is in
the anterior lower pole measuring 6 mm.  A 20-French counsel
catheter tip is in the musculature overlying the left kidney.  A 5-
French Nephro ureteral catheter is in place extending through the
left renal collecting system and into the bladder.  Left
perinephric stranding and gas bubbles are compatible with
postoperative changes.  No significant or large perinephric
hematoma.  A small amount of perinephric blood is present adjacent
to the lower pole.  This would be expected after OR lithotomy.

Bibasilar subsegmental atelectasis.

Liver, gallbladder, spleen, adrenal glands, pancreas are within
normal limits.

Trace free fluid in the pelvis.  Foley catheter decompresses the
bladder.
IMPRESSION: Most of the calculus burden in the left kidney has been removed.
Postoperative changes are noted.

20-French counsel catheter tip is in the musculature overlying the
left kidney.

Left Nephro ureteral catheter is in place.

Previously noted right ureteral calculus resolved.

Stable indeterminate lesion in the right kidney.  See previous
report for recommendations.

## 2016-12-23 ENCOUNTER — Emergency Department (HOSPITAL_BASED_OUTPATIENT_CLINIC_OR_DEPARTMENT_OTHER): Payer: 59

## 2016-12-23 ENCOUNTER — Emergency Department (HOSPITAL_BASED_OUTPATIENT_CLINIC_OR_DEPARTMENT_OTHER)
Admission: EM | Admit: 2016-12-23 | Discharge: 2016-12-23 | Disposition: A | Discharge status: Home / Self Care | Payer: 59 | Attending: Emergency Medicine | Admitting: Emergency Medicine

## 2016-12-23 ENCOUNTER — Encounter (HOSPITAL_BASED_OUTPATIENT_CLINIC_OR_DEPARTMENT_OTHER): Payer: Self-pay | Admitting: Emergency Medicine

## 2016-12-23 DIAGNOSIS — R109 Unspecified abdominal pain: Secondary | ICD-10-CM | POA: Diagnosis present

## 2016-12-23 DIAGNOSIS — N201 Calculus of ureter: Secondary | ICD-10-CM | POA: Diagnosis not present

## 2016-12-23 LAB — BASIC METABOLIC PANEL
Anion gap: 7 (ref 5–15)
BUN: 17 mg/dL (ref 6–20)
CALCIUM: 9.5 mg/dL (ref 8.9–10.3)
CO2: 27 mmol/L (ref 22–32)
Chloride: 102 mmol/L (ref 101–111)
Creatinine, Ser: 1.03 mg/dL (ref 0.61–1.24)
GFR calc Af Amer: >60 mL/min (ref >60)
GFR calc non Af Amer: >60 mL/min (ref >60)
Glucose, Bld: 125 mg/dL — ABNORMAL HIGH (ref 65–99)
Potassium: 4.4 mmol/L (ref 3.5–5.1)
Sodium: 136 mmol/L (ref 135–145)

## 2016-12-23 LAB — CBC WITH DIFFERENTIAL/PLATELET
BASOS ABS: 0 10*3/uL (ref 0.0–0.1)
BASOS PCT: 0 %
Eosinophils Absolute: 0.1 10*3/uL (ref 0.0–0.7)
Eosinophils Relative: 1 %
HEMATOCRIT: 42.8 % (ref 39.0–52.0)
HEMOGLOBIN: 14.4 g/dL (ref 13.0–17.0)
Lymphocytes Relative: 6 %
Lymphs Abs: 0.6 10*3/uL — ABNORMAL LOW (ref 0.7–4.0)
MCH: 28.2 pg (ref 26.0–34.0)
MCHC: 33.6 g/dL (ref 30.0–36.0)
MCV: 83.9 fL (ref 78.0–100.0)
MONOS PCT: 2 %
Monocytes Absolute: 0.2 10*3/uL (ref 0.1–1.0)
NEUTROS PCT: 91 %
Neutro Abs: 9.7 10*3/uL — ABNORMAL HIGH (ref 1.7–7.7)
Platelets: 210 10*3/uL (ref 150–400)
RBC: 5.1 MIL/uL (ref 4.22–5.81)
RDW: 13.3 % (ref 11.5–15.5)
WBC: 10.7 10*3/uL — ABNORMAL HIGH (ref 4.0–10.5)

## 2016-12-23 MED ORDER — HYDROCODONE-ACETAMINOPHEN 5-325 MG PO TABS: 1.0000 | ORAL_TABLET | Freq: Four times a day (QID) | ORAL | 0 refills | Status: AC | PRN

## 2016-12-23 MED ORDER — ONDANSETRON HCL 4 MG/2ML IJ SOLN
4.0000 mg | Freq: Once | INTRAMUSCULAR | Status: AC
  Administered 2016-12-23: 4 mg via INTRAVENOUS
  Filled 2016-12-23: qty 2

## 2016-12-23 MED ORDER — SODIUM CHLORIDE 0.9 % IV BOLUS (SEPSIS)
1000.0000 mL | Freq: Once | INTRAVENOUS | Status: AC
  Administered 2016-12-23: 1000 mL via INTRAVENOUS

## 2016-12-23 MED ORDER — HYDROMORPHONE HCL 1 MG/ML IJ SOLN
1.0000 mg | Freq: Once | INTRAMUSCULAR | Status: AC
Start: 2016-12-23 — End: 2016-12-23
  Administered 2016-12-23: 1 mg via INTRAVENOUS
  Filled 2016-12-23: qty 1

## 2016-12-23 MED ORDER — ONDANSETRON 4 MG PO TBDP: 4.0000 mg | ORAL_TABLET | Freq: Three times a day (TID) | ORAL | 1 refills | Status: AC | PRN

## 2016-12-23 MED ORDER — NAPROXEN 500 MG PO TABS: 500.0000 mg | ORAL_TABLET | Freq: Two times a day (BID) | ORAL | 0 refills | Status: AC

## 2016-12-23 MED ORDER — SODIUM CHLORIDE 0.9 % IV SOLN: INTRAVENOUS | Status: DC

## 2016-12-23 MED ORDER — KETOROLAC TROMETHAMINE 15 MG/ML IJ SOLN
15.0000 mg | Freq: Once | INTRAMUSCULAR | Status: AC
  Administered 2016-12-23: 15 mg via INTRAVENOUS
  Filled 2016-12-23: qty 1

## 2016-12-23 MED FILL — HYDROCODON-APAP 5-325: 5-325 | 2 days supply | Qty: 20 | Fill #0

## 2016-12-23 MED FILL — NAPROXEN 500 MG TABLET: 500 | 7 days supply | Qty: 14 | Fill #0

## 2016-12-23 MED FILL — ONDANSETRON ODT 4 MG TABLET: 4 | 4 days supply | Qty: 12 | Fill #0

## 2016-12-23 NOTE — ED Notes (Signed)
ED Provider at bedside. 

## 2016-12-23 NOTE — ED Triage Notes (Signed)
Flank pain on the left side. Hx of kidney stones and lithotripsy. Reports emesis x2 today. 1000MG  at 0730

## 2016-12-23 NOTE — ED Notes (Signed)
Pt verbalized understanding of discharge instructions and denies any further questions at this time.   

## 2016-12-23 NOTE — ED Notes (Signed)
Patient transported to CT 

## 2016-12-23 NOTE — Discharge Instructions (Signed)
CT scan confirms a 4 mm stone left ureter far down close to the bladder. Would expect you to pass it in the next 1-2 days. Take the Naprosyn on a regular basis take the hydrocodone as needed for pain relief. Take Zofran as needed for nausea and vomiting. Follow-up with urology.

## 2016-12-23 NOTE — ED Provider Notes (Signed)
[facility] Provider Note   CSN: [id number] Arrival date & time: 12/23/16  1040     History   Chief Complaint Chief Complaint  Patient presents with  . Flank Pain    HPI George Barton is a 41 y.o. male.  The patient with acute onset of left flank pain suggestive of past kidney stones. Symptom onset was 7 this morning. Pain is 10 out of 10. Associated with 2 episodes of vomiting. No fevers. Felt fine yesterday.      Past Medical History:  Diagnosis Date  . Elbow fracture, left 06-01-12   past hx.pinning-removed  . Hernia 06-01-12   in past-corrected self  . Kidney calculi   . Kidney stones   . Kidney stones    normally left side  . Shingles 06-01-12   2 yrs ago- outbreak around breast/back    There are no active problems to display for this patient.   Past Surgical History:  Procedure Laterality Date  . APPENDECTOMY    . CYSTOSCOPY W/ URETERAL STENT PLACEMENT  06/16/2012   Procedure: CYSTOSCOPY WITH RETROGRADE PYELOGRAM/URETERAL STENT PLACEMENT;  Surgeon: [name], MD;  Location: [facility];  Service: Urology;  Laterality: Left;  . NEPHROLITHOTOMY  06/14/2012   Procedure: NEPHROLITHOTOMY PERCUTANEOUS;  Surgeon: [name], MD;  Location: [facility];  Service: Urology;  Laterality: Left;  . TESTICLE SURGERY  06-01-12   as infant -undescended testicle       Home Medications    Prior to Admission medications   Medication Sig Start Date End Date Taking? Authorizing Provider  acetaminophen (TYLENOL) 500 MG tablet Take 1,000 mg by mouth every 6 (six) hours as needed. FOR PAIN   Yes Historical Provider, MD  ciprofloxacin (CIPRO) 500 MG tablet Take 1 tablet (500 mg total) by mouth 2 (two) times daily. 10/24/12  Yes Jerilee Field, MD  HYDROcodone-acetaminophen (NORCO/VICODIN) 5-325 MG tablet Take 1-2 tablets by mouth every 6 (six) hours as needed for moderate pain. 12/23/16   Vanetta Mulders, MD  Multiple Vitamin (MULTIVITAMIN WITH  MINERALS) TABS Take 1 tablet by mouth daily.    Historical Provider, MD  naproxen (NAPROSYN) 500 MG tablet Take 1 tablet (500 mg total) by mouth 2 (two) times daily. 12/23/16   Vanetta Mulders, MD  ondansetron (ZOFRAN ODT) 4 MG disintegrating tablet Take 1 tablet (4 mg total) by mouth every 8 (eight) hours as needed for nausea or vomiting. 12/23/16   Vanetta Mulders, MD  oxyCODONE-acetaminophen (ROXICET) 5-325 MG per tablet Take 1 tablet by mouth every 4 (four) hours as needed for pain. 10/24/12   Jerilee Field, MD  potassium citrate (UROCIT-K) 10 MEQ (1080 MG) SR tablet Take 10 mEq by mouth 3 (three) times daily with meals.    Historical Provider, MD    Family History No family history on file.  Social History Social History  Substance Use Topics  . Smoking status: Never Smoker  . Smokeless tobacco: Never Used  . Alcohol use No     Comment: rare-social     Allergies   Patient has no known allergies.   Review of Systems Review of Systems  Constitutional: Negative for fever.  HENT: Negative for congestion.   Eyes: Negative for redness.  Respiratory: Negative for shortness of breath.   Cardiovascular: Negative for chest pain.  Gastrointestinal: Positive for abdominal pain, nausea and vomiting.  Genitourinary: Positive for flank pain.  Skin: Negative for rash.  Neurological: Negative for headaches.  Hematological: Does not bruise/bleed easily.  Psychiatric/Behavioral: Negative for confusion.     Physical Exam Updated Vital Signs BP (!) 146/101 (BP Location: Right Arm)   Pulse 78   Temp 98.7 F (37.1 C)   Resp 20   Ht 6\' 3"  (1.905 m)   Wt 93 kg   SpO2 99%   BMI 25.62 kg/m   Physical Exam  Constitutional: He is oriented to person, place, and time. He appears well-developed and well-nourished. He appears distressed.  HENT:  Head: Normocephalic and atraumatic.  Mouth/Throat: Oropharynx is clear and moist.  Eyes: Conjunctivae and EOM are normal. Pupils are equal,  round, and reactive to light.  Neck: Normal range of motion. Neck supple.  Cardiovascular: Normal rate.   Pulmonary/Chest: Effort normal and breath sounds normal.  Abdominal: Bowel sounds are normal. There is no tenderness.  Musculoskeletal: Normal range of motion.  Neurological: He is alert and oriented to person, place, and time. No cranial nerve deficit or sensory deficit. He exhibits normal muscle tone. Coordination normal.  Skin: Skin is warm.  Nursing note and vitals reviewed.    ED Treatments / Results  Labs (all labs ordered are listed, but only abnormal results are displayed) Labs Reviewed  BASIC METABOLIC PANEL - Abnormal; Notable for the following:       Result Value   Glucose, Bld 125 (*)    All other components within normal limits  CBC WITH DIFFERENTIAL/PLATELET - Abnormal; Notable for the following:    WBC 10.7 (*)    Neutro Abs 9.7 (*)    Lymphs Abs 0.6 (*)    All other components within normal limits   Results for orders placed or performed during the hospital encounter of 12/23/16  Basic metabolic panel  Result Value Ref Range   Sodium 136 135 - 145 mmol/L   Potassium 4.4 3.5 - 5.1 mmol/L   Chloride 102 101 - 111 mmol/L   CO2 27 22 - 32 mmol/L   Glucose, Bld 125 (H) 65 - 99 mg/dL   BUN 17 6 - 20 mg/dL   Creatinine, Ser 1.61 0.61 - 1.24 mg/dL   Calcium 9.5 8.9 - 09.6 mg/dL   GFR calc non Af Amer >60 >60 mL/min   GFR calc Af Amer >60 >60 mL/min   Anion gap 7 5 - 15  CBC with Differential/Platelet  Result Value Ref Range   WBC 10.7 (H) 4.0 - 10.5 K/uL   RBC 5.10 4.22 - 5.81 MIL/uL   Hemoglobin 14.4 13.0 - 17.0 g/dL   HCT 04.5 40.9 - 81.1 %   MCV 83.9 78.0 - 100.0 fL   MCH 28.2 26.0 - 34.0 pg   MCHC 33.6 30.0 - 36.0 g/dL   RDW 91.4 78.2 - 95.6 %   Platelets 210 150 - 400 K/uL   Neutrophils Relative % 91 %   Neutro Abs 9.7 (H) 1.7 - 7.7 K/uL   Lymphocytes Relative 6 %   Lymphs Abs 0.6 (L) 0.7 - 4.0 K/uL   Monocytes Relative 2 %   Monocytes Absolute  0.2 0.1 - 1.0 K/uL   Eosinophils Relative 1 %   Eosinophils Absolute 0.1 0.0 - 0.7 K/uL   Basophils Relative 0 %   Basophils Absolute 0.0 0.0 - 0.1 K/uL    EKG  EKG Interpretation None       Radiology Ct Renal Stone Study  Result Date: 12/23/2016 CLINICAL DATA:  Left flank pain and left lower quadrant abdominal pain with vomiting. History kidney stones. EXAM: CT ABDOMEN AND PELVIS WITHOUT  CONTRAST TECHNIQUE: Multidetector CT imaging of the abdomen and pelvis was performed following the standard protocol without IV contrast. COMPARISON:  06/19/2012 FINDINGS: Lower chest: Normal. Hepatobiliary: No focal liver abnormality is seen. No gallstones, gallbladder wall thickening, or biliary dilatation. Pancreas: Unremarkable. No pancreatic ductal dilatation or surrounding inflammatory changes. Spleen: Normal in size without focal abnormality. Adrenals/Urinary Tract: 4 mm stone obstructing the distal left ureter several cm proximal to the bladder. Moderate left hydronephrosis with minimal perinephric soft tissue stranding. Multiple left renal calculi. 15 mm hyperdense cyst in the mid left kidney. One small right renal stone. Hyperdense cyst in the right lower pole, unchanged. Adrenal glands and bladder are normal. Stomach/Bowel: Stomach is within normal limits. Appendix has been removed. No evidence of bowel wall thickening, distention, or inflammatory changes. Vascular/Lymphatic: No significant vascular findings are present. No enlarged abdominal or pelvic lymph nodes. Reproductive: Prostate is unremarkable. Other: No abdominal wall hernia or abnormality. No abdominopelvic ascites. Musculoskeletal: No acute or significant osseous findings. IMPRESSION: 4 mm stone obstructing the distal left ureter with moderate left hydronephrosis. Bilateral renal calculi. Electronically Signed   By: Francene BoyersJames  Maxwell M.D.   On: 12/23/2016 11:40    Procedures Procedures (including critical care time)  Medications Ordered  in ED Medications  0.9 %  sodium chloride infusion (not administered)  sodium chloride 0.9 % bolus 1,000 mL (1,000 mLs Intravenous New Bag/Given 12/23/16 1120)  ondansetron (ZOFRAN) injection 4 mg (4 mg Intravenous Given 12/23/16 1120)  HYDROmorphone (DILAUDID) injection 1 mg (1 mg Intravenous Given 12/23/16 1120)  ketorolac (TORADOL) 15 MG/ML injection 15 mg (15 mg Intravenous Given 12/23/16 1120)     Initial Impression / Assessment and Plan / ED Course  I have reviewed the triage vital signs and the nursing notes.  Pertinent labs & imaging results that were available during my care of the patient were reviewed by me and considered in my medical decision making (see chart for details).  Clinical Course     Patient with a history of kidney stones has not had significant problems since 2013. Patient with acute onset this morning of left flank pain suggested a kidney stone. Patient also with some episodes of vomiting.  CT scan confirms a distal left ureter 4 mm stone. Also has multiple stones up in the left kidney. Patient with sniffing improvement here with the hydromorphone toward all and Zofran. Based on the size of the stone in the location already chance that he'll be of pass it on his own. Patient has urologist to follow-up with. Be treated with pain medicine anti-inflammatories antinausea medicine at home.  Final Clinical Impressions(s) / ED Diagnoses   Final diagnoses:  Left ureteral stone    New Prescriptions New Prescriptions   HYDROCODONE-ACETAMINOPHEN (NORCO/VICODIN) 5-325 MG TABLET    Take 1-2 tablets by mouth every 6 (six) hours as needed for moderate pain.   NAPROXEN (NAPROSYN) 500 MG TABLET    Take 1 tablet (500 mg total) by mouth 2 (two) times daily.   ONDANSETRON (ZOFRAN ODT) 4 MG DISINTEGRATING TABLET    Take 1 tablet (4 mg total) by mouth every 8 (eight) hours as needed for nausea or vomiting.     Vanetta MuldersScott Merlon Alcorta, MD 12/23/16 [phone]
